# Patient Record
Sex: Female | Born: 1960 | Race: White | Hispanic: No | Marital: Married | Smoking: Never smoker
Health system: Southern US, Community
[De-identification: ages and names within clinical notes are randomized; demographics above are authoritative.]

## PROBLEM LIST (undated history)

## (undated) DIAGNOSIS — K219 Gastro-esophageal reflux disease without esophagitis: Secondary | ICD-10-CM

## (undated) DIAGNOSIS — R011 Cardiac murmur, unspecified: Secondary | ICD-10-CM

## (undated) DIAGNOSIS — I370 Nonrheumatic pulmonary valve stenosis: Secondary | ICD-10-CM

## (undated) HISTORY — PX: OTHER SURGICAL HISTORY: SHX169

## (undated) HISTORY — PX: AUGMENTATION MAMMAPLASTY: SUR837

## (undated) HISTORY — PX: GALLBLADDER SURGERY: SHX652

---

## 1997-11-26 ENCOUNTER — Ambulatory Visit (HOSPITAL_COMMUNITY): Admission: RE | Admit: 1997-11-26 | Discharge: 1997-11-26 | Payer: Self-pay | Admitting: Specialist

## 1998-03-18 ENCOUNTER — Other Ambulatory Visit: Admission: RE | Admit: 1998-03-18 | Discharge: 1998-03-18 | Payer: Self-pay | Admitting: Gynecology

## 1999-03-19 ENCOUNTER — Other Ambulatory Visit: Admission: RE | Admit: 1999-03-19 | Discharge: 1999-03-19 | Payer: Self-pay | Admitting: Gynecology

## 2000-04-20 ENCOUNTER — Encounter: Payer: Self-pay | Admitting: Plastic Surgery

## 2000-04-20 ENCOUNTER — Encounter: Admission: RE | Admit: 2000-04-20 | Discharge: 2000-04-20 | Payer: Self-pay | Admitting: Plastic Surgery

## 2001-05-15 ENCOUNTER — Other Ambulatory Visit: Admission: RE | Admit: 2001-05-15 | Discharge: 2001-05-15 | Payer: Self-pay | Admitting: Gynecology

## 2001-06-07 ENCOUNTER — Encounter: Admission: RE | Admit: 2001-06-07 | Discharge: 2001-06-07 | Payer: Self-pay | Admitting: Gynecology

## 2001-06-07 ENCOUNTER — Encounter: Payer: Self-pay | Admitting: Gynecology

## 2002-05-21 ENCOUNTER — Other Ambulatory Visit: Admission: RE | Admit: 2002-05-21 | Discharge: 2002-05-21 | Payer: Self-pay | Admitting: Gynecology

## 2002-06-11 ENCOUNTER — Ambulatory Visit (HOSPITAL_COMMUNITY): Admission: RE | Admit: 2002-06-11 | Discharge: 2002-06-11 | Payer: Self-pay | Admitting: Gynecology

## 2002-06-11 ENCOUNTER — Encounter: Payer: Self-pay | Admitting: Gynecology

## 2003-06-25 ENCOUNTER — Other Ambulatory Visit: Admission: RE | Admit: 2003-06-25 | Discharge: 2003-06-25 | Payer: Self-pay | Admitting: Obstetrics and Gynecology

## 2003-06-25 ENCOUNTER — Ambulatory Visit (HOSPITAL_COMMUNITY): Admission: RE | Admit: 2003-06-25 | Discharge: 2003-06-25 | Payer: Self-pay | Admitting: Gynecology

## 2004-07-16 ENCOUNTER — Ambulatory Visit (HOSPITAL_COMMUNITY): Admission: RE | Admit: 2004-07-16 | Discharge: 2004-07-16 | Payer: Self-pay | Admitting: Gynecology

## 2004-07-16 ENCOUNTER — Other Ambulatory Visit: Admission: RE | Admit: 2004-07-16 | Discharge: 2004-07-16 | Payer: Self-pay | Admitting: Gynecology

## 2005-08-05 ENCOUNTER — Other Ambulatory Visit: Admission: RE | Admit: 2005-08-05 | Discharge: 2005-08-05 | Payer: Self-pay | Admitting: Gynecology

## 2005-08-12 ENCOUNTER — Ambulatory Visit (HOSPITAL_COMMUNITY): Admission: RE | Admit: 2005-08-12 | Discharge: 2005-08-12 | Payer: Self-pay | Admitting: Gynecology

## 2006-08-15 ENCOUNTER — Ambulatory Visit (HOSPITAL_COMMUNITY): Admission: RE | Admit: 2006-08-15 | Discharge: 2006-08-15 | Payer: Self-pay | Admitting: Gynecology

## 2007-08-17 ENCOUNTER — Ambulatory Visit (HOSPITAL_COMMUNITY): Admission: RE | Admit: 2007-08-17 | Discharge: 2007-08-17 | Payer: Self-pay | Admitting: Gynecology

## 2008-08-21 ENCOUNTER — Ambulatory Visit (HOSPITAL_COMMUNITY): Admission: RE | Admit: 2008-08-21 | Discharge: 2008-08-21 | Payer: Self-pay | Admitting: Gynecology

## 2009-06-02 ENCOUNTER — Ambulatory Visit: Payer: Self-pay | Admitting: Family Medicine

## 2009-06-09 ENCOUNTER — Inpatient Hospital Stay: Payer: Self-pay | Admitting: Surgery

## 2009-07-03 ENCOUNTER — Ambulatory Visit: Payer: Self-pay | Admitting: Family Medicine

## 2009-07-15 ENCOUNTER — Ambulatory Visit: Payer: Self-pay | Admitting: Family Medicine

## 2009-09-09 ENCOUNTER — Ambulatory Visit (HOSPITAL_COMMUNITY): Admission: RE | Admit: 2009-09-09 | Discharge: 2009-09-09 | Payer: Self-pay | Admitting: Gynecology

## 2009-10-30 ENCOUNTER — Ambulatory Visit: Payer: Self-pay | Admitting: Family Medicine

## 2010-03-11 IMAGING — CT CT CHEST W/ CM
1 series · 15 of 33 positions shown, 19 images · IV contrast (agent unspecified)
Comparison: none

REASON FOR EXAM: abn CXR  prominence left hilium
COMMENTS:

PROCEDURE:     CADET - CADET CHEST WITH CONTRAST  - July 15, 2009 [DATE]
RESULT:
TECHNIQUE: CT of the chest is performed utilizing 75 ml of Csovue-IVQ
iodinated intravenous contrast along with reconstruction at 5 mm slice
thickness.
There is no previous exam for comparison.

[Series 2: soft tissue · axial · 0.77mm/px · z∈[+112,+377]mm · 15 of 63 slices shown, 19 images]
[im 5/63  mediastinal]
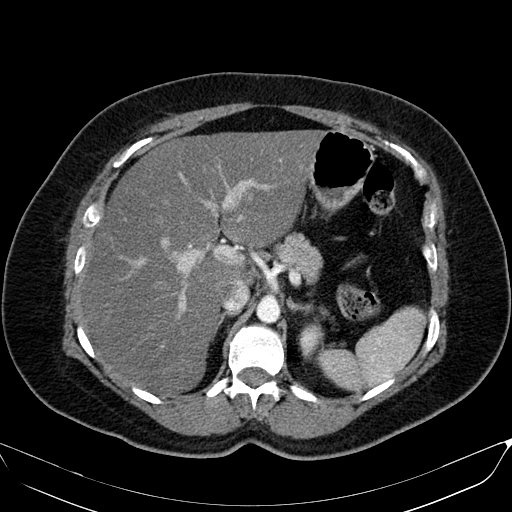
[im 5/63  lung]
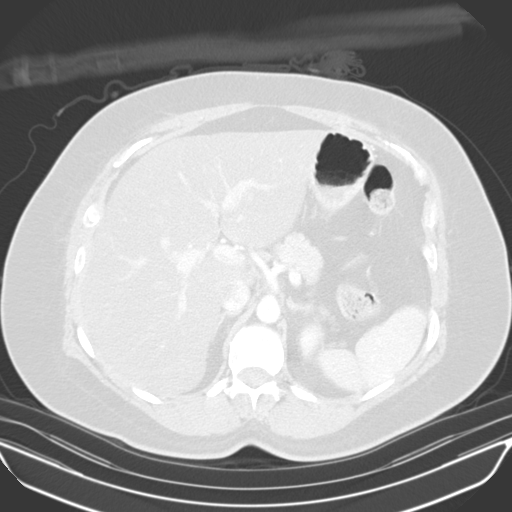
[im 10/63  lung]
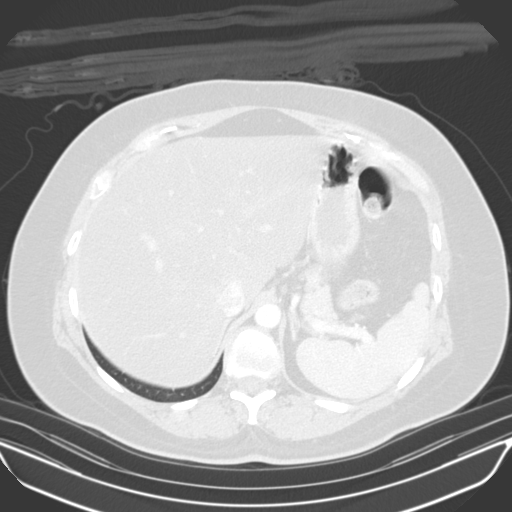
[im 13/63  lung]
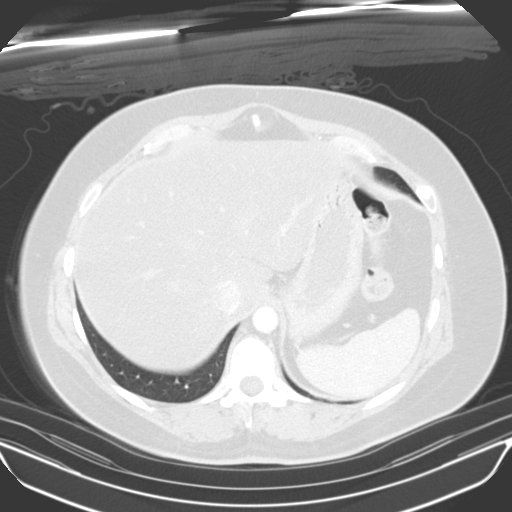
[im 17/63  lung]
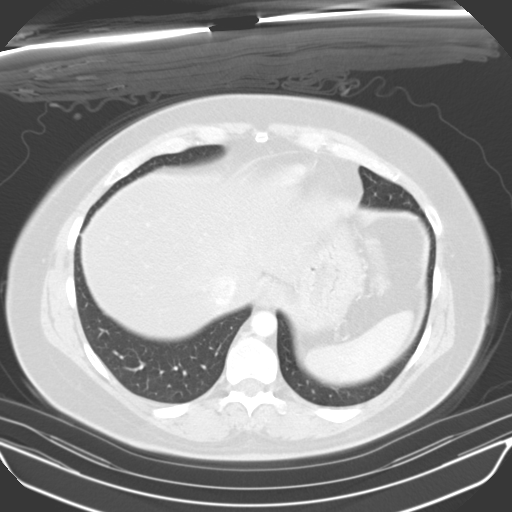
[im 21/63  mediastinal]
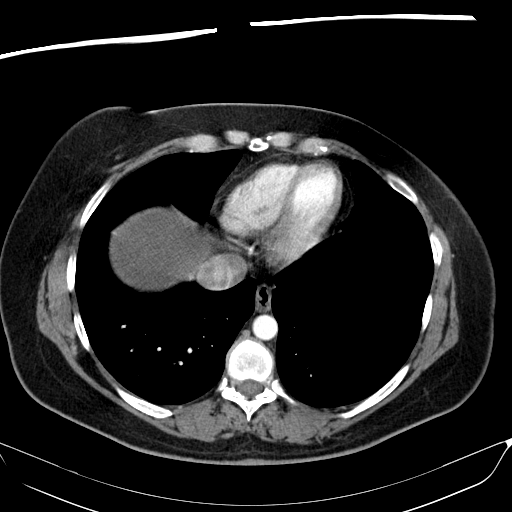
[im 21/63  lung]
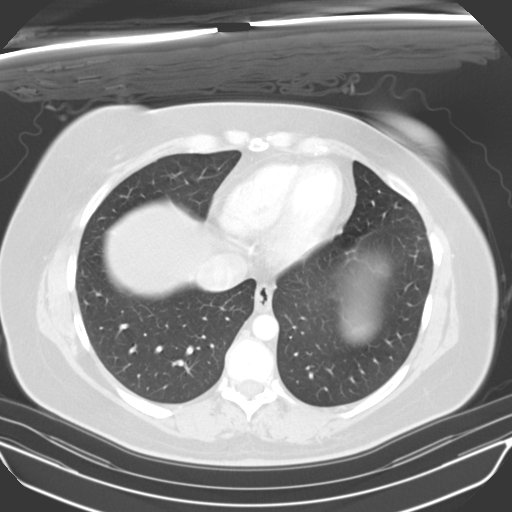
[im 25/63  lung]
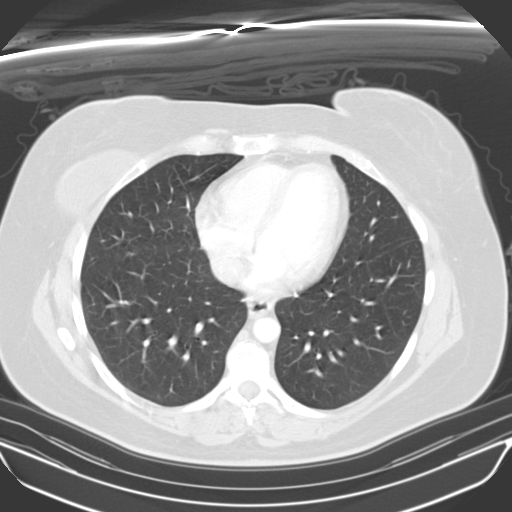
[im 28/63  lung]
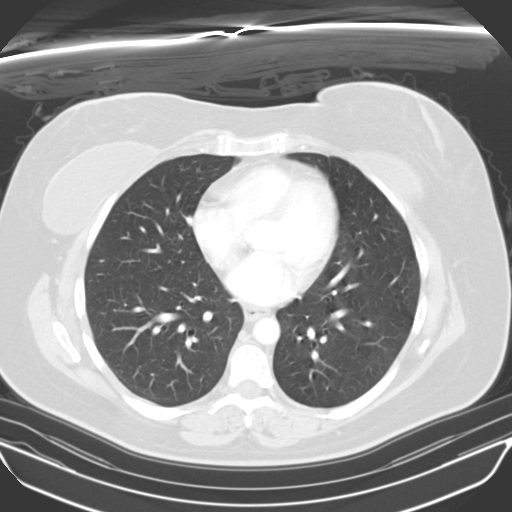
[im 33/63  lung]
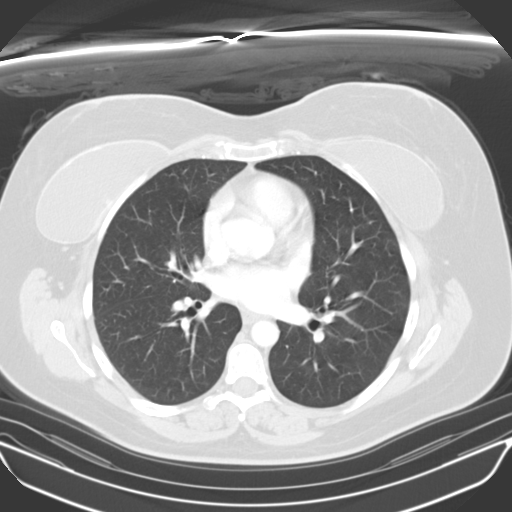
[im 35/63  mediastinal]
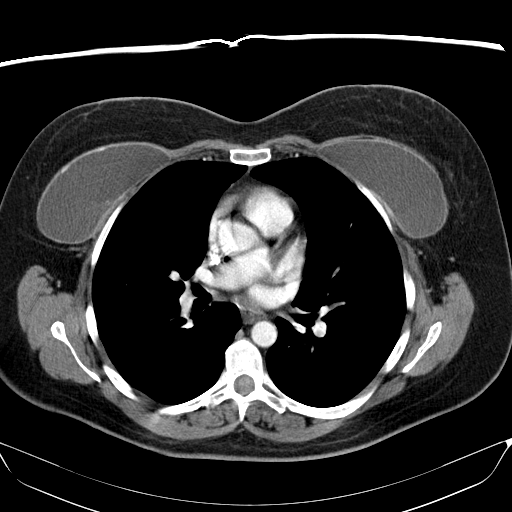
[im 35/63  lung]
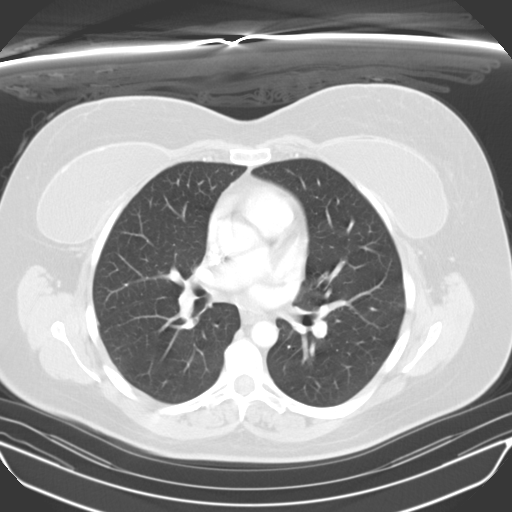
[im 38/63  lung]
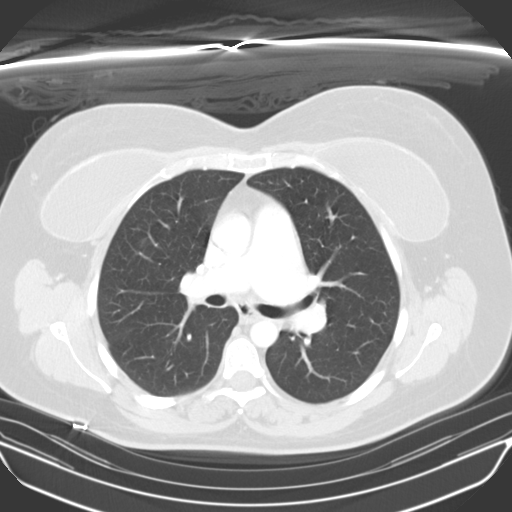
[im 42/63  lung]
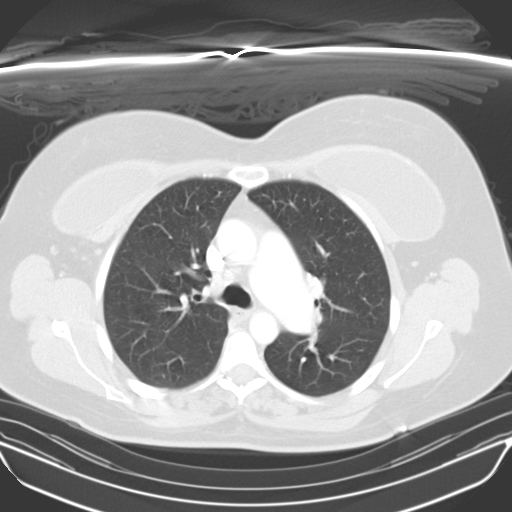
[im 46/63  lung]
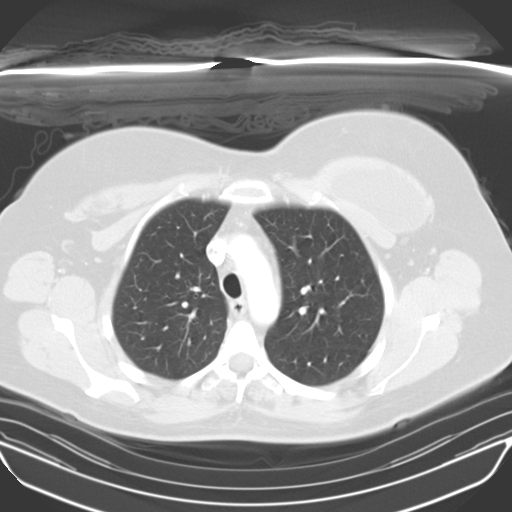
[im 50/63  mediastinal]
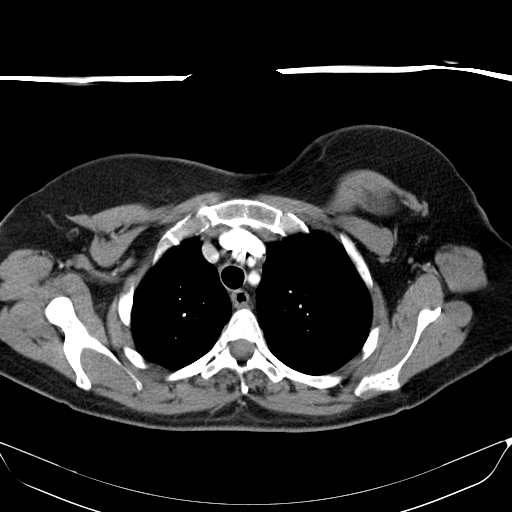
[im 50/63  lung]
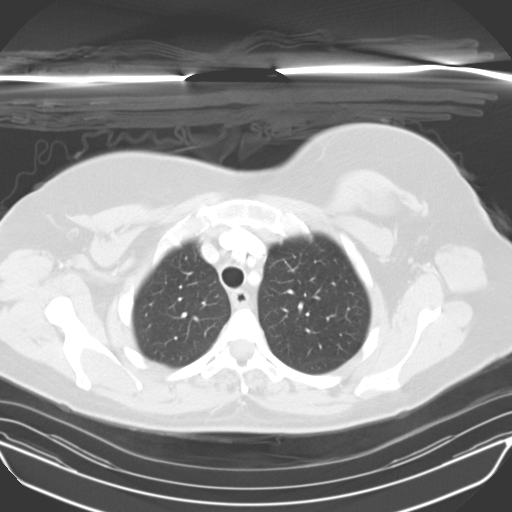
[im 53/63  lung]
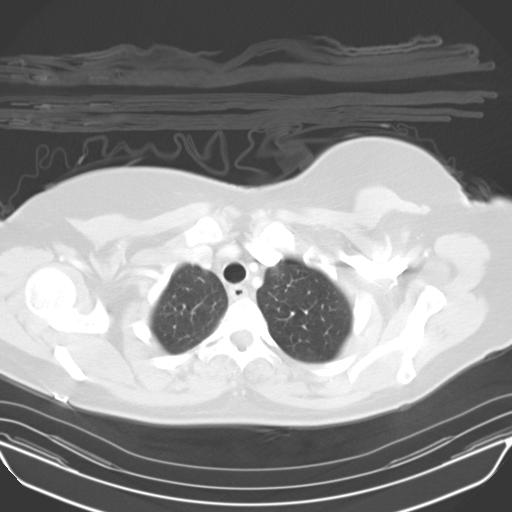
[im 58/63  lung]
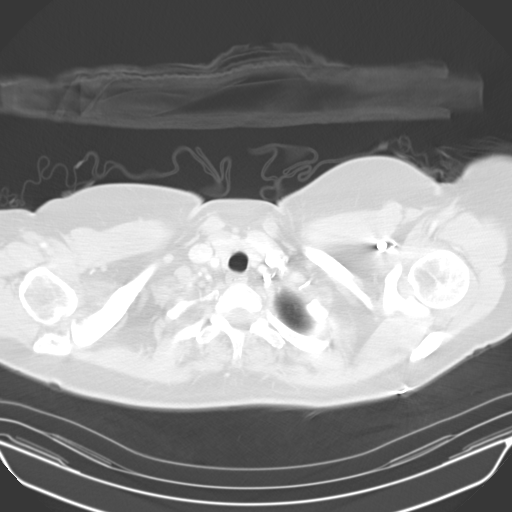

[15 of 33 positions shown; findings below may reference images not displayed]

FINDINGS: Prominence in the left hilar region is likely caused by an
enlarged pulmonary artery which on coronal reconstructions measures up to
4.7 cm in diameter on the left. The right pulmonary artery is less prominent
measuring up to 2.8 cm x 3.1 cm. The aorta is normal in caliber. The lungs
are clear. There is no adenopathy or infiltrate. There is significant fatty
infiltration of the liver diffusely. The adrenal glands are normal. There is
no effusion or pneumothorax. Breast implants are present.
IMPRESSION: 1.  Significant dilation of the left pulmonary artery. The right pulmonary
artery appears to be less prominent and the main pulmonary artery is within
normal limits proximally. This likely causes the prominence at the AP window
on the chest x-ray.
2.  No pulmonary embolism, aortic aneurysm or acute pulmonary parenchymal
abnormality.
3.  Diffuse fatty infiltration of the liver.

## 2010-08-10 ENCOUNTER — Other Ambulatory Visit (HOSPITAL_COMMUNITY): Payer: Self-pay | Admitting: Gynecology

## 2010-08-10 DIAGNOSIS — Z1231 Encounter for screening mammogram for malignant neoplasm of breast: Secondary | ICD-10-CM

## 2010-09-14 ENCOUNTER — Ambulatory Visit (HOSPITAL_COMMUNITY)
Admission: RE | Admit: 2010-09-14 | Discharge: 2010-09-14 | Disposition: A | Discharge status: Home / Self Care | Payer: BC Managed Care – PPO | Source: Ambulatory Visit | Attending: Gynecology | Admitting: Gynecology

## 2010-09-14 DIAGNOSIS — Z1231 Encounter for screening mammogram for malignant neoplasm of breast: Secondary | ICD-10-CM | POA: Insufficient documentation

## 2011-08-23 ENCOUNTER — Other Ambulatory Visit (HOSPITAL_COMMUNITY): Payer: Self-pay | Admitting: Gynecology

## 2011-08-23 DIAGNOSIS — Z1239 Encounter for other screening for malignant neoplasm of breast: Secondary | ICD-10-CM

## 2011-09-16 ENCOUNTER — Ambulatory Visit (HOSPITAL_COMMUNITY): Payer: BC Managed Care – PPO

## 2011-09-23 ENCOUNTER — Other Ambulatory Visit (HOSPITAL_COMMUNITY): Payer: Self-pay | Admitting: Gynecology

## 2011-09-23 ENCOUNTER — Ambulatory Visit (HOSPITAL_COMMUNITY)
Admission: RE | Admit: 2011-09-23 | Discharge: 2011-09-23 | Disposition: A | Discharge status: Home / Self Care | Payer: BC Managed Care – PPO | Source: Ambulatory Visit | Attending: Gynecology | Admitting: Gynecology

## 2011-09-23 DIAGNOSIS — Z1231 Encounter for screening mammogram for malignant neoplasm of breast: Secondary | ICD-10-CM

## 2011-09-23 DIAGNOSIS — Z1239 Encounter for other screening for malignant neoplasm of breast: Secondary | ICD-10-CM

## 2012-10-12 ENCOUNTER — Other Ambulatory Visit (HOSPITAL_COMMUNITY): Payer: Self-pay | Admitting: Gynecology

## 2012-10-12 DIAGNOSIS — Z1231 Encounter for screening mammogram for malignant neoplasm of breast: Secondary | ICD-10-CM

## 2012-10-25 ENCOUNTER — Ambulatory Visit (HOSPITAL_COMMUNITY)
Admission: RE | Admit: 2012-10-25 | Discharge: 2012-10-25 | Disposition: A | Discharge status: Home / Self Care | Payer: BC Managed Care – PPO | Source: Ambulatory Visit | Attending: Gynecology | Admitting: Gynecology

## 2012-10-25 DIAGNOSIS — Z1231 Encounter for screening mammogram for malignant neoplasm of breast: Secondary | ICD-10-CM | POA: Insufficient documentation

## 2013-01-26 ENCOUNTER — Ambulatory Visit: Payer: Self-pay | Admitting: Family Medicine

## 2013-06-19 ENCOUNTER — Ambulatory Visit: Payer: Self-pay | Admitting: Internal Medicine

## 2013-06-19 LAB — URINALYSIS, COMPLETE
Nitrite: NEGATIVE
Specific Gravity: 1.025 (ref 1.003–1.030)

## 2013-06-21 LAB — URINE CULTURE

## 2013-07-17 ENCOUNTER — Ambulatory Visit: Payer: Self-pay | Admitting: Family Medicine

## 2013-09-27 ENCOUNTER — Other Ambulatory Visit (HOSPITAL_COMMUNITY): Payer: Self-pay | Admitting: Gynecology

## 2013-09-27 DIAGNOSIS — Z1231 Encounter for screening mammogram for malignant neoplasm of breast: Secondary | ICD-10-CM

## 2013-10-25 ENCOUNTER — Ambulatory Visit: Payer: Self-pay

## 2013-10-25 LAB — RAPID STREP-A WITH REFLX: Micro Text Report: NEGATIVE

## 2013-10-27 LAB — BETA STREP CULTURE(ARMC)

## 2013-10-29 ENCOUNTER — Ambulatory Visit (HOSPITAL_COMMUNITY)
Admission: RE | Admit: 2013-10-29 | Discharge: 2013-10-29 | Disposition: A | Discharge status: Home / Self Care | Payer: BC Managed Care – PPO | Source: Ambulatory Visit | Attending: Gynecology | Admitting: Gynecology

## 2013-10-29 DIAGNOSIS — Z978 Presence of other specified devices: Secondary | ICD-10-CM | POA: Insufficient documentation

## 2013-10-29 DIAGNOSIS — Z1231 Encounter for screening mammogram for malignant neoplasm of breast: Secondary | ICD-10-CM | POA: Insufficient documentation

## 2013-12-18 ENCOUNTER — Ambulatory Visit: Payer: Self-pay | Admitting: Internal Medicine

## 2013-12-18 LAB — URINALYSIS, COMPLETE
BILIRUBIN, UR: NEGATIVE
Glucose,UR: 100 mg/dL (ref 0–75)
KETONE: NEGATIVE
Nitrite: POSITIVE
PH: 6 (ref 4.5–8.0)
Specific Gravity: 1.025 (ref 1.003–1.030)

## 2013-12-20 LAB — URINE CULTURE

## 2014-02-10 ENCOUNTER — Ambulatory Visit: Payer: Self-pay

## 2014-03-14 ENCOUNTER — Ambulatory Visit: Payer: Self-pay | Admitting: Gastroenterology

## 2014-03-31 ENCOUNTER — Ambulatory Visit: Payer: Self-pay | Admitting: Physician Assistant

## 2014-03-31 LAB — URINALYSIS, COMPLETE
BILIRUBIN, UR: NEGATIVE
GLUCOSE, UR: NEGATIVE
KETONE: NEGATIVE
Nitrite: NEGATIVE
PH: 6 (ref 5.0–8.0)
WBC UR: >30 /HPF (ref 0–5)

## 2014-04-02 LAB — URINE CULTURE

## 2014-08-18 ENCOUNTER — Ambulatory Visit: Payer: Self-pay | Admitting: Physician Assistant

## 2014-09-27 ENCOUNTER — Other Ambulatory Visit (HOSPITAL_COMMUNITY): Payer: Self-pay | Admitting: Obstetrics and Gynecology

## 2014-09-27 DIAGNOSIS — Z1231 Encounter for screening mammogram for malignant neoplasm of breast: Secondary | ICD-10-CM

## 2014-10-31 ENCOUNTER — Ambulatory Visit (HOSPITAL_COMMUNITY)
Admission: RE | Admit: 2014-10-31 | Discharge: 2014-10-31 | Disposition: A | Discharge status: Home / Self Care | Payer: BLUE CROSS/BLUE SHIELD | Source: Ambulatory Visit | Attending: Obstetrics and Gynecology | Admitting: Obstetrics and Gynecology

## 2014-10-31 DIAGNOSIS — Z1231 Encounter for screening mammogram for malignant neoplasm of breast: Secondary | ICD-10-CM | POA: Insufficient documentation

## 2015-03-27 ENCOUNTER — Encounter: Payer: Self-pay | Admitting: Emergency Medicine

## 2015-03-27 ENCOUNTER — Ambulatory Visit
Admission: EM | Admit: 2015-03-27 | Discharge: 2015-03-27 | Disposition: A | Discharge status: Home / Self Care | Payer: BLUE CROSS/BLUE SHIELD | Attending: Family Medicine | Admitting: Family Medicine

## 2015-03-27 DIAGNOSIS — H9203 Otalgia, bilateral: Secondary | ICD-10-CM

## 2015-03-27 DIAGNOSIS — H6983 Other specified disorders of Eustachian tube, bilateral: Secondary | ICD-10-CM | POA: Diagnosis not present

## 2015-03-27 DIAGNOSIS — T7029XA Other effects of high altitude, initial encounter: Secondary | ICD-10-CM

## 2015-03-27 MED ORDER — FEXOFENADINE-PSEUDOEPHED ER 180-240 MG PO TB24: 1.0000 | ORAL_TABLET | Freq: Every day | ORAL | Status: DC

## 2015-03-27 MED ORDER — CEFUROXIME AXETIL 500 MG PO TABS: 500.0000 mg | ORAL_TABLET | Freq: Two times a day (BID) | ORAL | Status: DC

## 2015-03-27 MED ORDER — MOMETASONE FUROATE 50 MCG/ACT NA SUSP: 2.0000 | Freq: Every day | NASAL | Status: DC

## 2015-03-27 NOTE — ED Provider Notes (Addendum)
CSN: [id number]     Arrival date & time 03/27/15  1616 History   First MD Initiated Contact with Patient 03/27/15 1700     Chief Complaint  Patient presents with  . Sinusitis  . Otalgia   (Consider location/radiation/quality/duration/timing/severity/associated sxs/prior Treatment) Patient is a 54 y.o. female presenting with sinusitis and ear pain. The history is provided by the patient. No language interpreter was used.  Sinusitis Pain details:    Location:  Maxillary   Quality:  Aching, dull and pressure   Severity:  Moderate Progression:  Waxing and waning Relieved by:  Nothing Ineffective treatments:  None tried Associated symptoms: ear pain and swollen glands   Associated symptoms: no chest pain, no chills, no congestion, no fatigue, no headaches, no hoarse voice, no rhinorrhea, no sneezing and no sore throat   Ear pain:    Severity:  Moderate   Onset quality:  Gradual   Progression:  Worsening Risk factors: no allergic reaction, no asthma, no BiPAP, no COPD, no CPAP use, no diabetes, no nasal cannula and no nasal polyps   Otalgia Associated symptoms: no congestion, no headaches, no rhinorrhea and no sore throat     History reviewed. No pertinent past medical history. Past Surgical History  Procedure Laterality Date  . Gallbladder surgery    . Tubiligation     History reviewed. No pertinent family history. Social History  Substance Use Topics  . Smoking status: Never Smoker   . Smokeless tobacco: None  . Alcohol Use: No   OB History    No data available     Review of Systems  Constitutional: Negative for chills and fatigue.  HENT: Positive for ear pain. Negative for congestion, hoarse voice, rhinorrhea, sneezing and sore throat.   Cardiovascular: Negative for chest pain.  Skin: Negative.   Neurological: Negative for headaches.  All other systems reviewed and are negative.   Allergies  Penicillins  Home Medications   Prior to Admission medications    Medication Sig Start Date End Date Taking? Authorizing Provider  cefUROXime (CEFTIN) 500 MG tablet Take 1 tablet (500 mg total) by mouth 2 (two) times daily. May fill between 03/29/2015 and 04/29/2015. Only fill if symptoms have not improved. 03/27/15   Hassan Rowan, MD  fexofenadine-pseudoephedrine (ALLEGRA-D ALLERGY & CONGESTION) 180-240 MG 24 hr tablet Take 1 tablet by mouth daily. 03/27/15   Hassan Rowan, MD  mometasone (NASONEX) 50 MCG/ACT nasal spray Place 2 sprays into the nose daily. 03/27/15   Hassan Rowan, MD   Meds Ordered and Administered this Visit  Medications - No data to display  BP 125/63 mmHg  Pulse 85  Temp(Src) 97.2 F (36.2 C) (Tympanic)  Resp 16  Ht  (1.702 m)  Wt 246 lb (111.585 kg)  BMI 38.52 kg/m2  SpO2 98% No data found.   Physical Exam  Constitutional: She is oriented to person, place, and time. She appears well-developed and well-nourished.  HENT:  Head: Normocephalic and atraumatic.  Right Ear: Hearing, external ear and ear canal normal. Tympanic membrane is bulging.  Left Ear: Hearing, external ear and ear canal normal. Tympanic membrane is bulging.  Nose: Mucosal edema and rhinorrhea present. Right sinus exhibits maxillary sinus tenderness. Right sinus exhibits no frontal sinus tenderness. Left sinus exhibits maxillary sinus tenderness. Left sinus exhibits no frontal sinus tenderness.  Mouth/Throat: Uvula is midline. She does not have dentures. No oral lesions. Normal dentition. No dental caries. No posterior oropharyngeal edema or posterior oropharyngeal erythema.  Eyes: Conjunctivae are  normal. Pupils are equal, round, and reactive to light.  Neck: Normal range of motion. Neck supple. No tracheal deviation present.  Musculoskeletal: Normal range of motion.  Lymphadenopathy:    She has cervical adenopathy.  Neurological: She is alert and oriented to person, place, and time.  Skin: Skin is warm and dry.  Psychiatric: Thought content normal.  Vitals  reviewed.   ED Course  Procedures (including critical care time)  Labs Review Labs Reviewed - No data to display  Imaging Review No results found.   Visual Acuity Review  Right Eye Distance:   Left Eye Distance:   Bilateral Distance:    Right Eye Near:   Left Eye Near:    Bilateral Near:         MDM   1. Barotrauma, initial encounter   2. Eustachian tube dysfunction, bilateral   3. Ear pain, bilateral    Explained patient this is eustachian tube dysfunction. Hopefully by using Nasonex nasal spray Allegra-D which can help open up the eustachian tube and make some improvement with the discomfort. If she is not better next 4872 hours she can then start Ceftin 500 mg 1 tablet twice a day. Patient declines work note.  Hassan Rowan, MD 03/27/15 1753  Hassan Rowan, MD 03/27/15 1754

## 2015-03-27 NOTE — Discharge Instructions (Signed)
Ear Barotrauma Ear barotrauma is injury to the eardrum that is caused by a pressure difference between the inside and the outside of the eardrum. Things that can make this happen include:   Flying in an airplane.  Coming to the surface too quickly after scuba diving.  Going to higher places (high altitudes) quickly.  Having an ear infection.  Being too close to a loud noise.  Being hit hard in the ear.  Working in a pressurized room.  Having swelling (inflammation) in your ear from a cold, an allergy, or an infection. HOME CARE  Take medicines only as told by your doctor.  Do not do the following until your doctor says it is okay:  Travel to places that are high above sea level.  Work in a pressurized room.  Scuba dive.  Use techniques to help with pressure changes in your ear as told by your doctor. These may include:  Chewing gum.  Swallowing.  Holding your nose and gently blowing to pop your ears.  Keep your ears dry. Use the corner of a towel to gently get water out of your ears after a shower or bath.  Keep all follow-up visits as told by your doctor. This is important. GET HELP IF:  Your symptoms do not get better, or they get worse.  You have a fever.  Your symptoms affect only one ear. GET HELP RIGHT AWAY IF:  You have a very bad headache.  You feel dizzy.  You have very bad ear pain.  You have blood or yellowish-white fluid (pus) coming from your ear.  You have hearing loss.  Your outer ear gets red and swollen.  You have swelling in the area behind your earlobe.   This information is not intended to replace advice given to you by your health care provider. Make sure you discuss any questions you have with your health care provider.   Document Released: 11/25/2009 Document Revised: 06/28/2014 Document Reviewed: 01/21/2014 Elsevier Interactive Patient Education [employer]

## 2015-03-27 NOTE — ED Notes (Signed)
Sinus pressure and ear pain for 6 days

## 2015-08-18 ENCOUNTER — Ambulatory Visit
Admission: EM | Admit: 2015-08-18 | Discharge: 2015-08-18 | Disposition: A | Discharge status: Home / Self Care | Payer: BLUE CROSS/BLUE SHIELD | Attending: Family Medicine | Admitting: Family Medicine

## 2015-08-18 ENCOUNTER — Encounter: Payer: Self-pay | Admitting: *Deleted

## 2015-08-18 DIAGNOSIS — J069 Acute upper respiratory infection, unspecified: Secondary | ICD-10-CM

## 2015-08-18 HISTORY — DX: Gastro-esophageal reflux disease without esophagitis: K21.9

## 2015-08-18 LAB — RAPID STREP SCREEN (MED CTR MEBANE ONLY): STREPTOCOCCUS, GROUP A SCREEN (DIRECT): NEGATIVE

## 2015-08-18 MED ORDER — AZITHROMYCIN 250 MG PO TABS: ORAL_TABLET | ORAL | Status: DC

## 2015-08-18 MED ORDER — FLUTICASONE PROPIONATE 50 MCG/ACT NA SUSP: 2.0000 | Freq: Every day | NASAL | Status: DC

## 2015-08-18 NOTE — Discharge Instructions (Signed)
Cool Mist Vaporizers °Vaporizers may help relieve the symptoms of a cough and cold. They add moisture to the air, which helps mucus to become thinner and less sticky. This makes it easier to breathe and cough up secretions. Cool mist vaporizers do not cause serious burns like hot mist vaporizers, which may also be called steamers or humidifiers. Vaporizers have not been proven to help with colds. You should not use a vaporizer if you are allergic to mold. °HOME CARE INSTRUCTIONS °· Follow the package instructions for the vaporizer. °· Do not use anything other than distilled water in the vaporizer. °· Do not run the vaporizer all of the time. This can cause mold or bacteria to grow in the vaporizer. °· Clean the vaporizer after each time it is used. °· Clean and dry the vaporizer well before storing it. °· Stop using the vaporizer if worsening respiratory symptoms develop. °  °This information is not intended to replace advice given to you by your health care provider. Make sure you discuss any questions you have with your health care provider. °  °Document Released: 03/04/2004 Document Revised: 06/12/2013 Document Reviewed: 10/25/2012 °Elsevier Interactive Patient Education ©2016 [employer] ° °Upper Respiratory Infection, Adult °Most upper respiratory infections (URIs) are a viral infection of the air passages leading to the lungs. A URI affects the nose, throat, and upper air passages. The most common type of URI is nasopharyngitis and is typically referred to as "the common cold." °URIs run their course and usually go away on their own. Most of the time, a URI does not require medical attention, but sometimes a bacterial infection in the upper airways can follow a viral infection. This is called a secondary infection. Sinus and middle ear infections are common types of secondary upper respiratory infections. °Bacterial pneumonia can also complicate a URI. A URI can worsen asthma and chronic obstructive  pulmonary disease (COPD). Sometimes, these complications can require emergency medical care and may be life threatening.  °CAUSES °Almost all URIs are caused by viruses. A virus is a type of germ and can spread from one person to another.  °RISKS FACTORS °You may be at risk for a URI if:  °· You smoke.   °· You have chronic heart or lung disease. °· You have a weakened defense (immune) system.   °· You are very young or very old.   °· You have nasal allergies or asthma. °· You work in crowded or poorly ventilated areas. °· You work in health care facilities or schools. °SIGNS AND SYMPTOMS  °Symptoms typically develop 2-3 days after you come in contact with a cold virus. Most viral URIs last 7-10 days. However, viral URIs from the influenza virus (flu virus) can last 14-18 days and are typically more severe. Symptoms may include:  °· Runny or stuffy (congested) nose.   °· Sneezing.   °· Cough.   °· Sore throat.   °· Headache.   °· Fatigue.   °· Fever.   °· Loss of appetite.   °· Pain in your forehead, behind your eyes, and over your cheekbones (sinus pain). °· Muscle aches.   °DIAGNOSIS  °Your health care provider may diagnose a URI by: °· Physical exam. °· Tests to check that your symptoms are not due to another condition such as: °¨ Strep throat. °¨ Sinusitis. °¨ Pneumonia. °¨ Asthma. °TREATMENT  °A URI goes away on its own with time. It cannot be cured with medicines, but medicines may be prescribed or recommended to relieve symptoms. Medicines may help: °· Reduce your fever. °· Reduce   your cough. °· Relieve nasal congestion. °HOME CARE INSTRUCTIONS  °· Take medicines only as directed by your health care provider.   °· Gargle warm saltwater or take cough drops to comfort your throat as directed by your health care provider. °· Use a warm mist humidifier or inhale steam from a shower to increase air moisture. This may make it easier to breathe. °· Drink enough fluid to keep your urine clear or pale yellow.   °· Eat  soups and other clear broths and maintain good nutrition.   °· Rest as needed.   °· Return to work when your temperature has returned to normal or as your health care provider advises. You may need to stay home longer to avoid infecting others. You can also use a face mask and careful hand washing to prevent spread of the virus. °· Increase the usage of your inhaler if you have asthma.   °· Do not use any tobacco products, including cigarettes, chewing tobacco, or electronic cigarettes. If you need help quitting, ask your health care provider. °PREVENTION  °The best way to protect yourself from getting a cold is to practice good hygiene.  °· Avoid oral or hand contact with people with cold symptoms.   °· Wash your hands often if contact occurs.   °There is no clear evidence that vitamin C, vitamin E, echinacea, or exercise reduces the chance of developing a cold. However, it is always recommended to get plenty of rest, exercise, and practice good nutrition.  °SEEK MEDICAL CARE IF:  °· You are getting worse rather than better.   °· Your symptoms are not controlled by medicine.   °· You have chills. °· You have worsening shortness of breath. °· You have brown or red mucus. °· You have yellow or brown nasal discharge. °· You have pain in your face, especially when you bend forward. °· You have a fever. °· You have swollen neck glands. °· You have pain while swallowing. °· You have white areas in the back of your throat. °SEEK IMMEDIATE MEDICAL CARE IF:  °· You have severe or persistent: °¨ Headache. °¨ Ear pain. °¨ Sinus pain. °¨ Chest pain. °· You have chronic lung disease and any of the following: °¨ Wheezing. °¨ Prolonged cough. °¨ Coughing up blood. °¨ A change in your usual mucus. °· You have a stiff neck. °· You have changes in your: °¨ Vision. °¨ Hearing. °¨ Thinking. °¨ Mood. °MAKE SURE YOU:  °· Understand these instructions. °· Will watch your condition. °· Will get help right away if you are not doing well or  get worse. °  °This information is not intended to replace advice given to you by your health care provider. Make sure you discuss any questions you have with your health care provider. °  °Document Released: 12/01/2000 Document Revised: 10/22/2014 Document Reviewed: 09/12/2013 °Elsevier Interactive Patient Education ©2016 Elsevier Inc[employer] ° °

## 2015-08-18 NOTE — ED Provider Notes (Signed)
CSN: [id number]     Arrival date & time 08/18/15  1033 History   First MD Initiated Contact with Patient 08/18/15 1139     Chief Complaint  Patient presents with  . Generalized Body Aches  . Fever   (Consider location/radiation/quality/duration/timing/severity/associated sxs/prior Treatment) HPI   This 55 year old female who presents with a three-day history of ear pain throat pain and fever. States that she's had fever and chills never took her temperature at since she doesn't have a thermometer at home. She has not had any coughing. She did have a flu shot in October  Past Medical History  Diagnosis Date  . GERD (gastroesophageal reflux disease)    Past Surgical History  Procedure Laterality Date  . Gallbladder surgery    . Tubiligation     History reviewed. No pertinent family history. Social History  Substance Use Topics  . Smoking status: Never Smoker   . Smokeless tobacco: None  . Alcohol Use: No   OB History    No data available     Review of Systems  Constitutional: Negative for chills, activity change and fatigue.  HENT: Positive for congestion, ear pain, postnasal drip, sinus pressure and sore throat.   Respiratory: Negative for cough.   All other systems reviewed and are negative.   Allergies  Penicillins  Home Medications   Prior to Admission medications   Medication Sig Start Date End Date Taking? Authorizing Provider  esomeprazole (NEXIUM) 40 MG capsule Take 40 mg by mouth daily at 12 noon.   Yes Historical Provider, MD  fexofenadine-pseudoephedrine (ALLEGRA-D ALLERGY & CONGESTION) 180-240 MG 24 hr tablet Take 1 tablet by mouth daily. 03/27/15  Yes Hassan Rowan, MD  azithromycin (ZITHROMAX Z-PAK) 250 MG tablet Use as per package instructions 08/18/15   Lutricia Feil, PA-C  cefUROXime (CEFTIN) 500 MG tablet Take 1 tablet (500 mg total) by mouth 2 (two) times daily. May fill between 03/29/2015 and 04/29/2015. Only fill if symptoms have not improved.  03/27/15   Hassan Rowan, MD  fluticasone (FLONASE) 50 MCG/ACT nasal spray Place 2 sprays into both nostrils daily. 08/18/15   Lutricia Feil, PA-C  mometasone (NASONEX) 50 MCG/ACT nasal spray Place 2 sprays into the nose daily. 03/27/15   Hassan Rowan, MD   Meds Ordered and Administered this Visit  Medications - No data to display  BP 104/74 mmHg  Pulse 116  Temp(Src) 99.6 F (37.6 C) (Oral)  Resp 18  Ht  (1.702 m)  Wt 210 lb (95.255 kg)  BMI 32.88 kg/m2  SpO2 97% No data found.   Physical Exam  Constitutional: She is oriented to person, place, and time. She appears well-developed and well-nourished. No distress.  HENT:  Head: Normocephalic and atraumatic.  Right Ear: External ear normal.  Left Ear: External ear normal.  Nose: Nose normal.  Mouth/Throat: Oropharynx is clear and moist. No oropharyngeal exudate.  Eyes: Conjunctivae are normal. Pupils are equal, round, and reactive to light.  Neck: Normal range of motion. Neck supple.  Pulmonary/Chest: Effort normal and breath sounds normal. No respiratory distress. She has no wheezes. She has no rales.  Musculoskeletal: Normal range of motion. She exhibits no edema or tenderness.  Lymphadenopathy:    She has no cervical adenopathy.  Neurological: She is alert and oriented to person, place, and time.  Skin: Skin is warm and dry. She is not diaphoretic.  Psychiatric: She has a normal mood and affect. Her behavior is normal. Judgment and thought content normal.  Nursing note and vitals reviewed.   ED Course  Procedures (including critical care time)  Labs Review Labs Reviewed  RAPID STREP SCREEN (NOT AT [facility])  CULTURE, GROUP A STREP [facility])    Imaging Review No results found.   Visual Acuity Review  Right Eye Distance:   Left Eye Distance:   Bilateral Distance:    Right Eye Near:   Left Eye Near:    Bilateral Near:         MDM   1. Acute URI    Discharge Medication List as of 08/18/2015 12:00 PM     START taking these medications   Details  azithromycin (ZITHROMAX Z-PAK) 250 MG tablet Use as per package instructions, Normal    fluticasone (FLONASE) 50 MCG/ACT nasal spray Place 2 sprays into both nostrils daily., Starting 08/18/2015, Until Discontinued, Normal      Plan: 1. Test/x-ray results and diagnosis reviewed with patient 2. rx as per orders; risks, benefits, potential side effects reviewed with patient 3. Recommend supportive treatment with rest and fluids. I have asked her to start using a Flonase nasal sprayer and to hold the Z-Pak for a week if she is not improving she may take it at that time. She should follow-up with her primary care if she is not improving or worsening. 4. F/u prn if symptoms worsen or don't improve     Lutricia Feil, PA-C 08/18/15 1219

## 2015-08-18 NOTE — ED Notes (Signed)
Patient started having body aches and fever yesterday that have developed into a sore throat and ear ache.

## 2015-08-20 LAB — CULTURE, GROUP A STREP (THRC)

## 2015-10-06 ENCOUNTER — Other Ambulatory Visit: Payer: Self-pay

## 2015-10-06 DIAGNOSIS — Z1231 Encounter for screening mammogram for malignant neoplasm of breast: Secondary | ICD-10-CM

## 2015-11-13 ENCOUNTER — Ambulatory Visit
Admission: RE | Admit: 2015-11-13 | Discharge: 2015-11-13 | Disposition: A | Discharge status: Home / Self Care | Payer: BLUE CROSS/BLUE SHIELD | Source: Ambulatory Visit

## 2015-11-13 DIAGNOSIS — Z1231 Encounter for screening mammogram for malignant neoplasm of breast: Secondary | ICD-10-CM

## 2016-08-14 ENCOUNTER — Ambulatory Visit
Admission: EM | Admit: 2016-08-14 | Discharge: 2016-08-14 | Disposition: A | Discharge status: Home / Self Care | Payer: BLUE CROSS/BLUE SHIELD | Attending: Emergency Medicine | Admitting: Emergency Medicine

## 2016-08-14 ENCOUNTER — Encounter: Payer: Self-pay | Admitting: Emergency Medicine

## 2016-08-14 DIAGNOSIS — J029 Acute pharyngitis, unspecified: Secondary | ICD-10-CM

## 2016-08-14 LAB — RAPID STREP SCREEN (MED CTR MEBANE ONLY): STREPTOCOCCUS, GROUP A SCREEN (DIRECT): NEGATIVE

## 2016-08-14 MED ORDER — PREDNISONE 20 MG PO TABS: ORAL_TABLET | ORAL | 0 refills | Status: DC

## 2016-08-14 MED ORDER — FLUTICASONE PROPIONATE 50 MCG/ACT NA SUSP: 2.0000 | Freq: Every day | NASAL | 0 refills | Status: DC

## 2016-08-14 MED ORDER — FEXOFENADINE-PSEUDOEPHED ER 60-120 MG PO TB12: 1.0000 | ORAL_TABLET | Freq: Two times a day (BID) | ORAL | 0 refills | Status: DC

## 2016-08-14 NOTE — ED Triage Notes (Signed)
Sore throat, hoarse and ear pain when swallowing for 3 days

## 2016-08-14 NOTE — Discharge Instructions (Signed)
Gargle with salt water 1/2-1 teaspoon per ounce of water necessary for comfort. Use Flonase nasal spray for one month.

## 2016-08-14 NOTE — ED Provider Notes (Signed)
CSN: [id number]     Arrival date & time 08/14/16  [id number] History   First MD Initiated Contact with Patient 08/14/16 0919     Chief Complaint  Patient presents with  . Sore Throat   (Consider location/radiation/quality/duration/timing/severity/associated sxs/prior Treatment) HPI  This a 56 year old female who presents with a three-day history of sore throat hoarseness and ear pain she's had no fever or chills. She has had no respiratory symptoms. Her records she has had a past history of recurrent eustachian tube dysfunction.      Past Medical History:  Diagnosis Date  . GERD (gastroesophageal reflux disease)    Past Surgical History:  Procedure Laterality Date  . GALLBLADDER SURGERY    . tubiligation     No family history on file. Social History  Substance Use Topics  . Smoking status: Never Smoker  . Smokeless tobacco: Never Used  . Alcohol use No   OB History    No data available     Review of Systems  Constitutional: Positive for activity change. Negative for chills, fatigue and fever.  HENT: Positive for congestion, ear pain, sore throat, trouble swallowing and voice change. Negative for ear discharge, postnasal drip, sinus pain and sinus pressure.   Respiratory: Negative for cough and shortness of breath.   All other systems reviewed and are negative.   Allergies  Penicillins  Home Medications   Prior to Admission medications   Medication Sig Start Date End Date Taking? Authorizing Provider  fexofenadine-pseudoephedrine (ALLEGRA-D) 60-120 MG 12 hr tablet Take 1 tablet by mouth every 12 (twelve) hours. 08/14/16   Lutricia FeilWilliam P Roemer, PA-C  fluticasone (FLONASE) 50 MCG/ACT nasal spray Place 2 sprays into both nostrils daily. 08/14/16   Lutricia FeilWilliam P Roemer, PA-C  predniSONE (DELTASONE) 20 MG tablet Take 2 tablets (40 mg) daily by mouth 08/14/16   Lutricia FeilWilliam P Roemer, PA-C   Meds Ordered and Administered this Visit  Medications - No data to display  BP 111/64 (BP Location:  Left Arm)   Pulse 86   Temp 98.2 F (36.8 C) (Oral)   Resp 16   Ht 5\' 7"  (1.702 m)   Wt 198 lb (89.8 kg)   SpO2 98%   BMI 31.01 kg/m  No data found.   Physical Exam  Constitutional: She is oriented to person, place, and time. She appears well-developed and well-nourished. No distress.  HENT:  Head: Normocephalic and atraumatic.  Left Ear: External ear normal.  Nose: Nose normal.  Mouth/Throat: Oropharynx is clear and moist. No oropharyngeal exudate.  Left ear TM is dark and dull  Eyes: EOM are normal. Pupils are equal, round, and reactive to light. Right eye exhibits no discharge. Left eye exhibits no discharge.  Neck: Normal range of motion. Neck supple.  Pulmonary/Chest: Effort normal and breath sounds normal. No respiratory distress. She has no wheezes. She has no rales.  Musculoskeletal: Normal range of motion.  Lymphadenopathy:    She has no cervical adenopathy.  Neurological: She is alert and oriented to person, place, and time.  Skin: Skin is warm and dry. She is not diaphoretic.  Psychiatric: She has a normal mood and affect. Her behavior is normal. Judgment and thought content normal.  Nursing note and vitals reviewed.   Urgent Care Course     Procedures (including critical care time)  Labs Review Labs Reviewed  RAPID STREP SCREEN (NOT AT [facility])  CULTURE, GROUP A STREP [facility])    Imaging Review No results found.   Visual Acuity Review  Right Eye Distance:   Left Eye Distance:   Bilateral Distance:    Right Eye Near:   Left Eye Near:    Bilateral Near:         MDM   1. Viral pharyngitis    Discharge Medication List as of 08/14/2016  9:40 AM    START taking these medications   Details  fexofenadine-pseudoephedrine (ALLEGRA-D) 60-120 MG 12 hr tablet Take 1 tablet by mouth every 12 (twelve) hours., Starting Sat 08/14/2016, Normal    predniSONE (DELTASONE) 20 MG tablet Take 2 tablets (40 mg) daily by mouth, Print      Plan: 1. Test/x-ray  results and diagnosis reviewed with patient 2. rx as per orders; risks, benefits, potential side effects reviewed with patient 3. Recommend supportive treatment with Use of Flonase daily month. Salt water gargles as necessary for comfort. Given her prescription for prednisone if she is not able to reduce her sore throat with salt water gargles and ibuprofen. I've told her this most likely a viral illness and will not require antibiotics. She will call in 48 hours for results of the pharyngeal swab. Patient is allergic to penicillin. 4. F/u prn if symptoms worsen or don't improve     Lutricia Feil, PA-C 08/14/16 [phone]

## 2016-08-16 ENCOUNTER — Ambulatory Visit
Admission: EM | Admit: 2016-08-16 | Discharge: 2016-08-16 | Disposition: A | Discharge status: Home / Self Care | Payer: BLUE CROSS/BLUE SHIELD | Attending: Family Medicine | Admitting: Family Medicine

## 2016-08-16 ENCOUNTER — Encounter: Payer: Self-pay | Admitting: *Deleted

## 2016-08-16 DIAGNOSIS — H6502 Acute serous otitis media, left ear: Secondary | ICD-10-CM

## 2016-08-16 MED ORDER — SULFAMETHOXAZOLE-TRIMETHOPRIM 800-160 MG PO TABS: 1.0000 | ORAL_TABLET | Freq: Two times a day (BID) | ORAL | 0 refills | Status: DC

## 2016-08-16 MED ORDER — FLUCONAZOLE 150 MG PO TABS: ORAL_TABLET | ORAL | 0 refills | Status: DC

## 2016-08-16 NOTE — ED Triage Notes (Signed)
Pt seen here Saturday. Pt states getting much worse.

## 2016-08-16 NOTE — ED Triage Notes (Signed)
Bilat ear pain, left is worse than right. States possible fever now. Denies drainage.

## 2016-08-16 NOTE — ED Provider Notes (Signed)
[facility]    CSN: [id number] Arrival date & time: 08/16/16  1313     History   Chief Complaint Chief Complaint  Patient presents with  . Otalgia    HPI Maureen Gallegos is a 56 y.o. female.    Otalgia  Location:  Bilateral (left greater than right) Behind ear:  No abnormality Quality:  Aching and pressure Severity:  Mild Onset quality:  Sudden Duration:  2 days Timing:  Constant Progression:  Worsening Chronicity:  New Context: recent URI   Context: not direct blow and not foreign body in ear   Relieved by:  Nothing Associated symptoms: congestion   Associated symptoms: no abdominal pain, no diarrhea, no ear discharge, no fever, no rash, no tinnitus and no vomiting   Risk factors: no recent travel, no chronic ear infection and no prior ear surgery     Past Medical History:  Diagnosis Date  . GERD (gastroesophageal reflux disease)     There are no active problems to display for this patient.   Past Surgical History:  Procedure Laterality Date  . GALLBLADDER SURGERY    . tubiligation      OB History    No data available       Home Medications    Prior to Admission medications   Medication Sig Start Date End Date Taking? Authorizing Provider  fexofenadine-pseudoephedrine (ALLEGRA-D) 60-120 MG 12 hr tablet Take 1 tablet by mouth every 12 (twelve) hours. 08/14/16  Yes Lutricia Feil, PA-C  fluticasone (FLONASE) 50 MCG/ACT nasal spray Place 2 sprays into both nostrils daily. 08/14/16  Yes Lutricia Feil, PA-C  predniSONE (DELTASONE) 20 MG tablet Take 2 tablets (40 mg) daily by mouth 08/14/16  Yes Lutricia Feil, PA-C  fluconazole (DIFLUCAN) 150 MG tablet 1 tab po once 08/16/16   Payton Mccallum, MD  sulfamethoxazole-trimethoprim (BACTRIM DS,SEPTRA DS) 800-160 MG tablet Take 1 tablet by mouth 2 (two) times daily. 08/16/16   Payton Mccallum, MD    Family History History reviewed. No pertinent family history.  Social History Social History    Substance Use Topics  . Smoking status: Never Smoker  . Smokeless tobacco: Never Used  . Alcohol use No     Allergies   Penicillins   Review of Systems Review of Systems  Constitutional: Negative for fever.  HENT: Positive for congestion and ear pain. Negative for ear discharge and tinnitus.   Gastrointestinal: Negative for abdominal pain, diarrhea and vomiting.  Skin: Negative for rash.     Physical Exam Triage Vital Signs ED Triage Vitals  Enc Vitals Group     BP 08/16/16 1330 116/67     Pulse Rate 08/16/16 1330 100     Resp 08/16/16 1330 16     Temp 08/16/16 1330 97.8 F (36.6 C)     Temp Source 08/16/16 1330 Oral     SpO2 08/16/16 1330 99 %     Weight 08/16/16 1332 198 lb (89.8 kg)     Height 08/16/16 1332 5\' 7"  (1.702 m)     Head Circumference --      Peak Flow --      Pain Score 08/16/16 1345 6     Pain Loc --      Pain Edu? --      Excl. in GC? --    No data found.   Updated Vital Signs BP 116/67 (BP Location: Left Arm)   Pulse 100   Temp 97.8 F (36.6 C) (Oral)  Resp 16   Ht 5\' 7"  (1.702 m)   Wt 198 lb (89.8 kg)   SpO2 99%   BMI 31.01 kg/m   Visual Acuity Right Eye Distance:   Left Eye Distance:   Bilateral Distance:    Right Eye Near:   Left Eye Near:    Bilateral Near:     Physical Exam  Constitutional: She appears well-developed and well-nourished. No distress.  HENT:  Head: Normocephalic and atraumatic.  Right Ear: External ear and ear canal normal. Tympanic membrane is erythematous.  Left Ear: External ear and ear canal normal. Tympanic membrane is erythematous and bulging. A middle ear effusion is present.  Nose: Rhinorrhea present. No mucosal edema, nose lacerations, sinus tenderness, nasal deformity, septal deviation or nasal septal hematoma. No epistaxis.  No foreign bodies. Right sinus exhibits no maxillary sinus tenderness and no frontal sinus tenderness. Left sinus exhibits no maxillary sinus tenderness and no frontal sinus  tenderness.  Mouth/Throat: Uvula is midline, oropharynx is clear and moist and mucous membranes are normal. No oropharyngeal exudate.  Eyes: Conjunctivae and EOM are normal. Pupils are equal, round, and reactive to light. Right eye exhibits no discharge. Left eye exhibits no discharge. No scleral icterus.  Neck: Normal range of motion. Neck supple. No thyromegaly present.  Cardiovascular: Normal rate.   Pulmonary/Chest: Effort normal. No respiratory distress.  Lymphadenopathy:    She has no cervical adenopathy.  Skin: She is not diaphoretic.  Nursing note and vitals reviewed.    UC Treatments / Results  Labs (all labs ordered are listed, but only abnormal results are displayed) Labs Reviewed - No data to display  EKG  EKG Interpretation None       Radiology No results found.  Procedures Procedures (including critical care time)  Medications Ordered in UC Medications - No data to display   Initial Impression / Assessment and Plan / UC Course  I have reviewed the triage vital signs and the nursing notes.  Pertinent labs & imaging results that were available during my care of the patient were reviewed by me and considered in my medical decision making (see chart for details).       Final Clinical Impressions(s) / UC Diagnoses   Final diagnoses:  Acute serous otitis media of left ear, recurrence not specified    New Prescriptions Discharge Medication List as of 08/16/2016  1:44 PM    START taking these medications   Details  fluconazole (DIFLUCAN) 150 MG tablet 1 tab po once, Normal    sulfamethoxazole-trimethoprim (BACTRIM DS,SEPTRA DS) 800-160 MG tablet Take 1 tablet by mouth 2 (two) times daily., Starting Mon 08/16/2016, Normal       1. diagnosis reviewed with patient 2. rx as per orders above; reviewed possible side effects, interactions, risks and benefits  3. Recommend supportive treatment with otc analgesics prn 4. Follow-up prn if symptoms worsen or  don't improve   Payton Mccallumrlando Tasheena Wambolt, MD 08/16/16 1429

## 2016-08-17 LAB — CULTURE, GROUP A STREP (THRC)

## 2016-10-11 ENCOUNTER — Other Ambulatory Visit: Payer: Self-pay | Admitting: Obstetrics and Gynecology

## 2016-10-11 DIAGNOSIS — Z1231 Encounter for screening mammogram for malignant neoplasm of breast: Secondary | ICD-10-CM

## 2016-11-16 ENCOUNTER — Ambulatory Visit
Admission: RE | Admit: 2016-11-16 | Discharge: 2016-11-16 | Disposition: A | Discharge status: Home / Self Care | Payer: BLUE CROSS/BLUE SHIELD | Source: Ambulatory Visit | Attending: Obstetrics and Gynecology | Admitting: Obstetrics and Gynecology

## 2016-11-16 DIAGNOSIS — Z1231 Encounter for screening mammogram for malignant neoplasm of breast: Secondary | ICD-10-CM

## 2017-11-04 ENCOUNTER — Other Ambulatory Visit: Payer: Self-pay | Admitting: Obstetrics and Gynecology

## 2017-11-04 DIAGNOSIS — Z1231 Encounter for screening mammogram for malignant neoplasm of breast: Secondary | ICD-10-CM

## 2017-11-25 ENCOUNTER — Ambulatory Visit
Admission: RE | Admit: 2017-11-25 | Discharge: 2017-11-25 | Disposition: A | Discharge status: Home / Self Care | Payer: BLUE CROSS/BLUE SHIELD | Source: Ambulatory Visit | Attending: Obstetrics and Gynecology | Admitting: Obstetrics and Gynecology

## 2017-11-25 DIAGNOSIS — Z1231 Encounter for screening mammogram for malignant neoplasm of breast: Secondary | ICD-10-CM

## 2018-05-17 ENCOUNTER — Encounter: Payer: Self-pay | Admitting: Emergency Medicine

## 2018-05-17 ENCOUNTER — Emergency Department
Admission: EM | Admit: 2018-05-17 | Discharge: 2018-05-17 | Disposition: A | Discharge status: Home / Self Care | Payer: BLUE CROSS/BLUE SHIELD | Attending: Emergency Medicine | Admitting: Emergency Medicine

## 2018-05-17 ENCOUNTER — Emergency Department: Payer: BLUE CROSS/BLUE SHIELD

## 2018-05-17 DIAGNOSIS — R0789 Other chest pain: Secondary | ICD-10-CM | POA: Diagnosis not present

## 2018-05-17 DIAGNOSIS — Z79899 Other long term (current) drug therapy: Secondary | ICD-10-CM | POA: Insufficient documentation

## 2018-05-17 DIAGNOSIS — R079 Chest pain, unspecified: Secondary | ICD-10-CM | POA: Diagnosis present

## 2018-05-17 HISTORY — DX: Nonrheumatic pulmonary valve stenosis: I37.0

## 2018-05-17 HISTORY — DX: Cardiac murmur, unspecified: R01.1

## 2018-05-17 LAB — CBC
HEMATOCRIT: 45.3 % (ref 36.0–46.0)
Hemoglobin: 14.9 g/dL (ref 12.0–15.0)
MCH: 28.7 pg (ref 26.0–34.0)
MCHC: 32.9 g/dL (ref 30.0–36.0)
MCV: 87.3 fL (ref 80.0–100.0)
Platelets: 285 10*3/uL (ref 150–400)
RBC: 5.19 MIL/uL — AB (ref 3.87–5.11)
RDW: 13.8 % (ref 11.5–15.5)
WBC: 6.5 10*3/uL (ref 4.0–10.5)
nRBC: 0 % (ref 0.0–0.2)

## 2018-05-17 LAB — PROTIME-INR
INR: 0.94
PROTHROMBIN TIME: 12.5 s (ref 11.4–15.2)

## 2018-05-17 LAB — BASIC METABOLIC PANEL
ANION GAP: 12 (ref 5–15)
BUN: 11 mg/dL (ref 6–20)
CO2: 23 mmol/L (ref 22–32)
Calcium: 9.8 mg/dL (ref 8.9–10.3)
Chloride: 105 mmol/L (ref 98–111)
Creatinine, Ser: 0.72 mg/dL (ref 0.44–1.00)
GFR calc Af Amer: >60 mL/min (ref >60)
GFR calc non Af Amer: >60 mL/min (ref >60)
GLUCOSE: 107 mg/dL — AB (ref 70–99)
Potassium: 3.6 mmol/L (ref 3.5–5.1)
Sodium: 140 mmol/L (ref 135–145)

## 2018-05-17 LAB — TROPONIN I: Troponin I: <0.03 ng/mL (ref <0.03)

## 2018-05-17 MED ORDER — FAMOTIDINE IN NACL 20-0.9 MG/50ML-% IV SOLN
20.0000 mg | Freq: Once | INTRAVENOUS | Status: AC
  Administered 2018-05-17: 20 mg via INTRAVENOUS
  Filled 2018-05-17: qty 50

## 2018-05-17 MED ORDER — CYCLOBENZAPRINE HCL 5 MG PO TABS: 5.0000 mg | ORAL_TABLET | Freq: Three times a day (TID) | ORAL | 0 refills | Status: DC | PRN

## 2018-05-17 MED ORDER — ASPIRIN 325 MG PO TABS
325.0000 mg | ORAL_TABLET | Freq: Once | ORAL | Status: AC
  Administered 2018-05-17: 325 mg via ORAL
  Filled 2018-05-17: qty 1

## 2018-05-17 MED ORDER — CYCLOBENZAPRINE HCL 10 MG PO TABS
5.0000 mg | ORAL_TABLET | Freq: Once | ORAL | Status: AC
  Administered 2018-05-17: 5 mg via ORAL
  Filled 2018-05-17: qty 1

## 2018-05-17 NOTE — ED Provider Notes (Signed)
Patient's second troponin negative. States that she continues to feel better. She currently does not have a primary care doctor so did encourage patient to establish care with doctor.    Phineas SemenGoodman, Maureen Marrin, MD 05/17/18 [phone]

## 2018-05-17 NOTE — ED Notes (Signed)
ED Provider at bedside. 

## 2018-05-17 NOTE — ED Notes (Signed)
Pt assisted to bedside commode. Peri care performed independently.

## 2018-05-17 NOTE — Discharge Instructions (Signed)
Follow up with cardiology.   Take motrin for pain   Take flexeril for muscle spasms   See your doctor.   Return to ER if you have worse chest pain, shortness of breath.

## 2018-05-17 NOTE — ED Provider Notes (Signed)
[facility] EMERGENCY DEPARTMENT Provider Note   CSN: [id number] Arrival date & time: 05/17/18  1205     History   Chief Complaint Chief Complaint  Patient presents with  . Chest Pain    HPI Maureen Gallegos is a 57 y.o. female history of reflux, pulmonary stenosis, here presenting with chest pain.  Patient states that she did pick up some heavy box aches yesterday.  She woke up this morning with bilateral upper chest pain that radiated to her neck and down bilateral arms.  Denies any shortness of breath or pleuritic chest pain.  Denies any recent travel or history of CAD.   The history is provided by the patient.    Past Medical History:  Diagnosis Date  . GERD (gastroesophageal reflux disease)   . Heart murmur   . Pulmonary stenosis    as a child    There are no active problems to display for this patient.   Past Surgical History:  Procedure Laterality Date  . AUGMENTATION MAMMAPLASTY Bilateral   . GALLBLADDER SURGERY    . tubiligation       OB History   None      Home Medications    Prior to Admission medications   Medication Sig Start Date End Date Taking? Authorizing Provider  Ascorbic Acid (VITAMIN C) 1000 MG tablet Take 1,000 mg by mouth daily.   Yes [provider]  Cholecalciferol (VITAMIN D3) 1.25 MG (50000 UT) TABS Take 5,000 Units by mouth every evening.   Yes [provider]  esomeprazole (NEXIUM) 40 MG capsule Take 40 mg by mouth 2 (two) times daily. 02/11/16  Yes [provider]  fluconazole (DIFLUCAN) 150 MG tablet 1 tab po once 08/16/16  Yes Conty, Orlando, MD  vitamin B-12 (CYANOCOBALAMIN) 100 MCG tablet Take 100 mcg by mouth daily.   Yes [provider]    Family History No family history on file.  Social History Social History   Tobacco Use  . Smoking status: Never Smoker  . Smokeless tobacco: Never Used  Substance Use Topics  . Alcohol use: No  . Drug use: No      Allergies   Penicillins   Review of Systems Review of Systems  Cardiovascular: Positive for chest pain.  All other systems reviewed and are negative.    Physical Exam Updated Vital Signs BP 122/79   Pulse 74   Temp 98.4 F (36.9 C) (Oral)   Resp 12   Ht 5\' 7"  (1.702 m)   Wt 111.1 kg   SpO2 97%   BMI 38.37 kg/m   Physical Exam  Constitutional: She is oriented to person, place, and time. She appears well-developed and well-nourished.  HENT:  Head: Normocephalic.  Eyes: Pupils are equal, round, and reactive to light. EOM are normal.  Neck: Normal range of motion. Neck supple.  Cardiovascular: Normal rate, regular rhythm and normal pulses.  Pulmonary/Chest: Effort normal and breath sounds normal.  Reproducible upper chest tenderness   Abdominal: Soft. Bowel sounds are normal.  Musculoskeletal: Normal range of motion.       Right lower leg: Normal.       Left lower leg: Normal.  Neurological: She is alert and oriented to person, place, and time.  Skin: Skin is warm. Capillary refill takes less than 2 seconds.  Psychiatric: She has a normal mood and affect. Her behavior is normal.  Nursing note and vitals reviewed.    ED Treatments / Results  Labs (all  labs ordered are listed, but only abnormal results are displayed) Labs Reviewed  BASIC METABOLIC PANEL - Abnormal; Notable for the following components:      Result Value   Glucose, Bld 107 (*)    All other components within normal limits  CBC - Abnormal; Notable for the following components:   RBC 5.19 (*)    All other components within normal limits  TROPONIN I  PROTIME-INR  TROPONIN I    EKG EKG Interpretation  Date/Time:  Wednesday May 17 2018 12:11:05 EST Ventricular Rate:  90 PR Interval:  146 QRS Duration: 110 QT Interval:  368 QTC Calculation: 450 R Axis:   -39 Text Interpretation:  Normal sinus rhythm with sinus arrhythmia Left axis deviation Pulmonary disease pattern Incomplete  right bundle branch block Left ventricular hypertrophy with repolarization abnormality Abnormal ECG When compared with ECG of 09-Jun-2009 00:49, Premature ventricular complexes are no longer Present Confirmed by Richardean CanalYao, David H [phone]([id number]) on 05/17/2018 12:23:11 PM   Radiology Dg Chest 2 View  Result Date: 05/17/2018 CLINICAL DATA:  Chest pain. EXAM: CHEST - 2 VIEW COMPARISON:  Chest two-view 01/26/2013, chest CT 07/15/2009 FINDINGS: Part size normal. Enlarged left pulmonary artery as noted on CT. Aortic arch on the left is normal. Normal vascularity. Lungs are clear without infiltrate effusion or mass. IMPRESSION: No active cardiopulmonary disease. Chronic enlargement left pulmonary artery. Electronically Signed   By: Marlan Palauharles  Clark M.D.   On: 05/17/2018 12:52    Procedures Procedures (including critical care time)  Medications Ordered in ED Medications  famotidine (PEPCID) IVPB 20 mg premix ( Intravenous Stopped 05/17/18 1355)  aspirin tablet 325 mg (325 mg Oral Given 05/17/18 1321)  cyclobenzaprine (FLEXERIL) tablet 5 mg (5 mg Oral Given 05/17/18 1321)     Initial Impression / Assessment and Plan / ED Course  I have reviewed the triage vital signs and the nursing notes.  Pertinent labs & imaging results that were available during my care of the patient were reviewed by me and considered in my medical decision making (see chart for details).    Maureen Gallegos is a 57 y.o. female here with chest pain. She did pick up heavy boxes yesterday. Likely muscle strain. Low suspicion for ACS, I doubt dissection or PE. Will get labs, trop x 2, CXR. Will give motrin, flexeril.   3:33 PM Patient' initial trop neg. EKG unremarkable. Second trop at 3:30 pm. If negative, anticipate dc home. Signed out to Dr. Derrill KayGoodman.    Final Clinical Impressions(s) / ED Diagnoses   Final diagnoses:  None    ED Discharge Orders    None       Charlynne PanderYao, David Hsienta, MD 05/17/18 [phone]

## 2018-05-17 NOTE — ED Triage Notes (Signed)
Patient presents to the ED with upper chest pain radiating into her neck that began last night.  Patient states chest pain has been becoming more uncomfortable as time has gone on.  Patient reports nauseous and anxious.  Denies shortness of breath.

## 2018-12-06 ENCOUNTER — Other Ambulatory Visit: Payer: Self-pay | Admitting: Obstetrics and Gynecology

## 2018-12-06 DIAGNOSIS — Z1231 Encounter for screening mammogram for malignant neoplasm of breast: Secondary | ICD-10-CM

## 2019-01-19 ENCOUNTER — Other Ambulatory Visit: Payer: Self-pay

## 2019-01-19 ENCOUNTER — Ambulatory Visit
Admission: RE | Admit: 2019-01-19 | Discharge: 2019-01-19 | Disposition: A | Discharge status: Home / Self Care | Payer: BLUE CROSS/BLUE SHIELD | Source: Ambulatory Visit | Attending: Obstetrics and Gynecology | Admitting: Obstetrics and Gynecology

## 2019-01-19 DIAGNOSIS — Z1231 Encounter for screening mammogram for malignant neoplasm of breast: Secondary | ICD-10-CM

## 2019-04-02 ENCOUNTER — Other Ambulatory Visit: Payer: Self-pay

## 2019-04-02 DIAGNOSIS — Z20822 Contact with and (suspected) exposure to covid-19: Secondary | ICD-10-CM

## 2019-04-03 LAB — NOVEL CORONAVIRUS, NAA: SARS-CoV-2, NAA: NOT DETECTED

## 2019-04-25 DIAGNOSIS — K219 Gastro-esophageal reflux disease without esophagitis: Secondary | ICD-10-CM | POA: Insufficient documentation

## 2019-05-02 DIAGNOSIS — I38 Endocarditis, valve unspecified: Secondary | ICD-10-CM | POA: Insufficient documentation

## 2019-12-17 ENCOUNTER — Other Ambulatory Visit: Payer: Self-pay | Admitting: *Deleted

## 2019-12-17 DIAGNOSIS — N390 Urinary tract infection, site not specified: Secondary | ICD-10-CM

## 2019-12-18 ENCOUNTER — Other Ambulatory Visit: Payer: Self-pay

## 2019-12-18 ENCOUNTER — Other Ambulatory Visit
Admission: RE | Admit: 2019-12-18 | Discharge: 2019-12-18 | Disposition: A | Discharge status: Home / Self Care | Payer: BC Managed Care – PPO | Attending: Urology | Admitting: Urology

## 2019-12-18 ENCOUNTER — Encounter: Payer: Self-pay | Admitting: Urology

## 2019-12-18 ENCOUNTER — Ambulatory Visit (INDEPENDENT_AMBULATORY_CARE_PROVIDER_SITE_OTHER): Payer: BC Managed Care – PPO | Admitting: Urology

## 2019-12-18 VITALS — BP 133/87 | HR 88 | Ht 67.0 in | Wt 259.0 lb

## 2019-12-18 DIAGNOSIS — N390 Urinary tract infection, site not specified: Secondary | ICD-10-CM | POA: Diagnosis present

## 2019-12-18 DIAGNOSIS — R3121 Asymptomatic microscopic hematuria: Secondary | ICD-10-CM

## 2019-12-18 LAB — URINALYSIS, COMPLETE (UACMP) WITH MICROSCOPIC
Bacteria, UA: NONE SEEN
Bilirubin Urine: NEGATIVE
Glucose, UA: NEGATIVE mg/dL
Hgb urine dipstick: NEGATIVE
Ketones, ur: NEGATIVE mg/dL
Nitrite: NEGATIVE
Protein, ur: NEGATIVE mg/dL
RBC / HPF: NONE SEEN RBC/hpf (ref 0–5)
Specific Gravity, Urine: 1.025 (ref 1.005–1.030)
pH: 6.5 (ref 5.0–8.0)

## 2019-12-18 LAB — BLADDER SCAN AMB NON-IMAGING

## 2019-12-18 NOTE — Progress Notes (Signed)
12/18/19 2:13 PM   Maureen Gallegos Jan 29, 1961 [id number]  CC: microscopic hematuria, hx of rUTI  HPI: I saw Maureen Gallegos in urology clinic today for evaluation of microscopic hematuria and history of recurrent UTI.  She is a 59 year old healthy female with no prior smoking history and no carcinogenic exposures who was recently found to have 4-10 RBCs on urinalysis.  A urinalysis was performed for some abnormal sensation with urination, but no distinct burning, urgency, or frequency.  Urine culture was negative.  She has been on Bactrim by a different provider for 3 to 4 years for history of UTIs.  She denies any UTIs while on the low-dose antibiotic.  She denies any history of gross hematuria.  On reviewing her prior urinalyses, she does have a history of microscopic hematuria with 4-10 RBCs seen on a urine sample from 2016.  She denies any family history of bladder or kidney cancer.  Urinalysis today is benign with 6-10 squamous cells, 20 WBCs, 0 bacteria, 0 RBCs.  PVR is normal today at 110 mL.  PMH: Past Medical History:  Diagnosis Date  . GERD (gastroesophageal reflux disease)   . Heart murmur   . Pulmonary stenosis    as a child    Surgical History: Past Surgical History:  Procedure Laterality Date  . AUGMENTATION MAMMAPLASTY Bilateral   . GALLBLADDER SURGERY    . tubiligation      Family History: No family history on file.  Social History:  reports that she has never smoked. She has never used smokeless tobacco. She reports that she does not drink alcohol and does not use drugs.  Physical Exam: BP 133/87   Pulse 88   Ht 5\' 7"  (1.702 m)   Wt 259 lb (117.5 kg)   BMI 40.57 kg/m    Constitutional:  Alert and oriented, No acute distress. Cardiovascular: No clubbing, cyanosis, or edema. Respiratory: Normal respiratory effort, no increased work of breathing. GI: Abdomen is soft, nontender, nondistended, no abdominal masses  Laboratory Data: Reviewed, see  HPI  Pertinent Imaging: None to review  Assessment & Plan:   In summary, she is a 59 year old female with a reported history of recurrent UTIs, who has been on low-dose Bactrim daily for 3 to 4 years without any infections.  She also has microscopic hematuria with 4-10 RBCs that was seen in 2016, as well as recently in June 2021.  She denies any history of gross hematuria or carcinogenic risk factors.  Regarding her daily Bactrim, I recommended discontinuing this medication and transitioning to daily cranberry tablet prophylaxis.  We discussed that if she developed recurrent infections despite cranberry tablets, we could always consider restarting the Bactrim, however we typically do not prescribe long-term antibiotic prophylaxis over 1 year except in rare cases.  Regarding her asymptomatic microscopic hematuria, we reviewed the new AUA guidelines regarding the low, intermediate, and high risk categories, as well as risk factors for urothelial cell carcinoma.  Her history is certainly reassuring, and she also had microscopic hematuria in 2016.  We discussed options including observation with close follow-up and repeat urinalysis in 6 months, ultrasound, ultrasound and cystoscopy, or CT urogram and cystoscopy.  She would like to pursue observation which is very reasonable.  If repeat urinalysis in 6 months has persistent microscopic hematuria, would recommend starting with renal ultrasound for work-up with her low risk.  Discontinue Bactrim, cranberry tablets daily for UTI prophylaxis RTC 6 months with urinalysis prior   2017, MD 12/18/2019  [facility] [address], [place], [place] [address] [phone]

## 2019-12-18 NOTE — Patient Instructions (Signed)
Stop Bactrim, start cranberry tablets daily for UTI prophylaxis  Let us know if you see any blood in the urine, or develop UTIs off the antibiotics

## 2019-12-27 ENCOUNTER — Other Ambulatory Visit: Payer: Self-pay | Admitting: Internal Medicine

## 2019-12-27 DIAGNOSIS — Z1231 Encounter for screening mammogram for malignant neoplasm of breast: Secondary | ICD-10-CM

## 2020-01-21 ENCOUNTER — Other Ambulatory Visit: Payer: Self-pay

## 2020-01-21 ENCOUNTER — Ambulatory Visit
Admission: RE | Admit: 2020-01-21 | Discharge: 2020-01-21 | Disposition: A | Discharge status: Home / Self Care | Payer: BC Managed Care – PPO | Source: Ambulatory Visit | Attending: Internal Medicine | Admitting: Internal Medicine

## 2020-01-21 DIAGNOSIS — Z1231 Encounter for screening mammogram for malignant neoplasm of breast: Secondary | ICD-10-CM

## 2020-07-15 ENCOUNTER — Ambulatory Visit: Payer: BC Managed Care – PPO | Admitting: Urology

## 2020-12-18 ENCOUNTER — Other Ambulatory Visit: Payer: Self-pay | Admitting: Internal Medicine

## 2020-12-18 DIAGNOSIS — Z1231 Encounter for screening mammogram for malignant neoplasm of breast: Secondary | ICD-10-CM

## 2021-02-10 ENCOUNTER — Ambulatory Visit
Admission: RE | Admit: 2021-02-10 | Discharge: 2021-02-10 | Disposition: A | Discharge status: Home / Self Care | Payer: BC Managed Care – PPO | Source: Ambulatory Visit | Attending: Internal Medicine | Admitting: Internal Medicine

## 2021-02-10 ENCOUNTER — Other Ambulatory Visit: Payer: Self-pay

## 2021-02-10 DIAGNOSIS — Z1231 Encounter for screening mammogram for malignant neoplasm of breast: Secondary | ICD-10-CM

## 2021-04-21 LAB — HM COLONOSCOPY

## 2021-07-05 ENCOUNTER — Other Ambulatory Visit: Payer: Self-pay

## 2021-07-05 ENCOUNTER — Ambulatory Visit
Admission: RE | Admit: 2021-07-05 | Discharge: 2021-07-05 | Disposition: A | Discharge status: Home / Self Care | Payer: BC Managed Care – PPO | Source: Ambulatory Visit | Attending: Emergency Medicine | Admitting: Emergency Medicine

## 2021-07-05 VITALS — BP 117/77 | HR 81 | Temp 98.4°F | Resp 18

## 2021-07-05 DIAGNOSIS — B372 Candidiasis of skin and nail: Secondary | ICD-10-CM | POA: Diagnosis not present

## 2021-07-05 MED ORDER — NYSTATIN 100000 UNIT/GM EX CREA: TOPICAL_CREAM | CUTANEOUS | 1 refills | Status: DC

## 2021-07-05 NOTE — ED Provider Notes (Signed)
Roderic Palau    CSN: [id number] Arrival date & time: 07/05/21  0854      History   Chief Complaint Chief Complaint  Patient presents with   Rash    HPI Maureen Gallegos is a 61 y.o. female.  Patient presents with a tender red rash under her left breast that she noticed yesterday.  No open wounds or drainage.  No fever, chills, sore throat, cough, shortness of breath, or other symptoms.  No treatments at home.  No history of similar rash.    The history is provided by the patient and medical records.   Past Medical History:  Diagnosis Date   GERD (gastroesophageal reflux disease)    Heart murmur    Pulmonary stenosis    as a child    There are no problems to display for this patient.   Past Surgical History:  Procedure Laterality Date   AUGMENTATION MAMMAPLASTY Bilateral    GALLBLADDER SURGERY     tubiligation      OB History   No obstetric history on file.      Home Medications    Prior to Admission medications   Medication Sig Start Date End Date Taking? Authorizing Provider  nystatin cream (MYCOSTATIN) Apply to affected area 2 times daily for 14 days. 07/05/21  Yes Sharion Balloon, NP  Apoaequorin (PREVAGEN) 10 MG CAPS Take by mouth.    [provider]  Ascorbic Acid (VITAMIN C) 1000 MG tablet Take 1,000 mg by mouth daily.    [provider]  celecoxib (CELEBREX) 200 MG capsule Take 200 mg by mouth daily.  06/27/19   [provider]  Cholecalciferol (VITAMIN D3) 1.25 MG (50000 UT) TABS Take 5,000 Units by mouth every evening.    [provider]  esomeprazole (NEXIUM) 20 MG capsule Take 20 mg by mouth daily at 12 noon.    [provider]  sulfamethoxazole-trimethoprim (BACTRIM DS) 800-160 MG tablet Take 1 tablet by mouth daily. 12/11/19   [provider]  vitamin B-12 (CYANOCOBALAMIN) 100 MCG tablet Take 100 mcg by mouth daily.    [provider]    Family History History reviewed. No  pertinent family history.  Social History Social History   Tobacco Use   Smoking status: Never   Smokeless tobacco: Never  Substance Use Topics   Alcohol use: No   Drug use: No     Allergies   Penicillins   Review of Systems Review of Systems  Constitutional:  Negative for chills and fever.  HENT:  Negative for ear pain and sore throat.   Respiratory:  Negative for cough and shortness of breath.   Cardiovascular:  Negative for chest pain and palpitations.  Skin:  Positive for color change and rash.  All other systems reviewed and are negative.   Physical Exam Triage Vital Signs ED Triage Vitals  Enc Vitals Group     BP 07/05/21 0909 117/77     Pulse Rate 07/05/21 0909 81     Resp 07/05/21 0909 18     Temp 07/05/21 0909 98.4 F (36.9 C)     Temp src --      SpO2 07/05/21 0909 97 %     Weight --      Height --      Head Circumference --      Peak Flow --      Pain Score 07/05/21 0920 4     Pain Loc --  Pain Edu? --      Excl. in [place]? --    No data found.  Updated Vital Signs BP 117/77 (BP Location: Left Arm)    Pulse 81    Temp 98.4 F (36.9 C)    Resp 18    SpO2 97%   Visual Acuity Right Eye Distance:   Left Eye Distance:   Bilateral Distance:    Right Eye Near:   Left Eye Near:    Bilateral Near:     Physical Exam Vitals and nursing note reviewed.  Constitutional:      General: She is not in acute distress.    Appearance: She is well-developed. She is not ill-appearing.  HENT:     Mouth/Throat:     Mouth: Mucous membranes are moist.  Cardiovascular:     Rate and Rhythm: Normal rate and regular rhythm.     Heart sounds: Normal heart sounds.  Pulmonary:     Effort: Pulmonary effort is normal. No respiratory distress.     Breath sounds: Normal breath sounds.  Abdominal:     Palpations: Abdomen is soft.     Tenderness: There is no abdominal tenderness.  Musculoskeletal:     Cervical back: Neck supple.  Skin:    General: Skin is warm  and dry.     Findings: Rash present.     Comments: Erythematous rash under left breast.  No open wounds or drainage.   Neurological:     Mental Status: She is alert.  Psychiatric:        Mood and Affect: Mood normal.        Behavior: Behavior normal.     UC Treatments / Results  Labs (all labs ordered are listed, but only abnormal results are displayed) Labs Reviewed - No data to display  EKG   Radiology No results found.  Procedures Procedures (including critical care time)  Medications Ordered in UC Medications - No data to display  Initial Impression / Assessment and Plan / UC Course  I have reviewed the triage vital signs and the nursing notes.  Pertinent labs & imaging results that were available during my care of the patient were reviewed by me and considered in my medical decision making (see chart for details).    Candidal dermatitis.  Treating with Nystatin cream.  Education provided on skin yeast infection.  Instructed patient to follow up with her PCP if not improving.  She agrees to plan of care.   Final Clinical Impressions(s) / UC Diagnoses   Final diagnoses:  Candidal dermatitis     Discharge Instructions      Use the Nystatin cream as directed.  Follow up with your primary care provider if your symptoms are not improving.         ED Prescriptions     Medication Sig Dispense Auth. Provider   nystatin cream (MYCOSTATIN) Apply to affected area 2 times daily for 14 days. 30 g Sharion Balloon, NP      PDMP not reviewed this encounter.   Sharion Balloon, NP 07/05/21 [phone]

## 2021-07-05 NOTE — Discharge Instructions (Addendum)
Use the Nystatin cream as directed.  Follow up with your primary care provider if your symptoms are not improving.    

## 2021-07-05 NOTE — ED Triage Notes (Signed)
Pt here with large, circular, blistering rash under left breast since yesterday. Pt describes the pain as burning sensation.

## 2021-07-20 DIAGNOSIS — E785 Hyperlipidemia, unspecified: Secondary | ICD-10-CM | POA: Insufficient documentation

## 2021-11-03 ENCOUNTER — Other Ambulatory Visit: Payer: Self-pay | Admitting: Internal Medicine

## 2021-11-03 DIAGNOSIS — Z1231 Encounter for screening mammogram for malignant neoplasm of breast: Secondary | ICD-10-CM

## 2021-12-07 ENCOUNTER — Ambulatory Visit
Admission: RE | Admit: 2021-12-07 | Discharge: 2021-12-07 | Disposition: A | Discharge status: Home / Self Care | Payer: BC Managed Care – PPO | Source: Ambulatory Visit | Attending: Family Medicine | Admitting: Family Medicine

## 2021-12-07 VITALS — BP 111/76 | HR 103 | Temp 99.5°F | Resp 18

## 2021-12-07 DIAGNOSIS — J0140 Acute pansinusitis, unspecified: Secondary | ICD-10-CM

## 2021-12-07 MED ORDER — SULFAMETHOXAZOLE-TRIMETHOPRIM 800-160 MG PO TABS: 1.0000 | ORAL_TABLET | Freq: Two times a day (BID) | ORAL | 0 refills | Status: DC

## 2021-12-07 MED ORDER — FLUCONAZOLE 150 MG PO TABS: 150.0000 mg | ORAL_TABLET | Freq: Once | ORAL | 0 refills | Status: AC

## 2021-12-07 MED ORDER — IPRATROPIUM BROMIDE 0.03 % NA SOLN: 2.0000 | Freq: Two times a day (BID) | NASAL | 0 refills | Status: DC | PRN

## 2021-12-07 NOTE — ED Triage Notes (Signed)
Pt presents with sinus pressure, HA, congestion x 3 days.

## 2021-12-07 NOTE — ED Provider Notes (Signed)
[name]    CSN: [id number] Arrival date & time: 12/07/21  1139      History   Chief Complaint Chief Complaint  Patient presents with   Headache    Sinus headache, pain in my face, ear pain (both ears), stuffy nose. - Entered by patient   Sinus Pressure    HPI Maureen Gallegos is a 61 y.o. female.   HPI Patient presents for evaluation of sinus headache, facial pressure and fullness of both ears and stuffy nose. She has an occasional cough due to post nasal drainage. She is afebrile. Endorses teeth pain due to pressure and puffiness and darkened lines under both eyelids. She has taken OTC medication without relief of symptoms. Past Medical History:  Diagnosis Date   GERD (gastroesophageal reflux disease)    Heart murmur    Pulmonary stenosis    as a child    There are no problems to display for this patient.   Past Surgical History:  Procedure Laterality Date   AUGMENTATION MAMMAPLASTY Bilateral    GALLBLADDER SURGERY     tubiligation      OB History   No obstetric history on file.      Home Medications    Prior to Admission medications   Medication Sig Start Date End Date Taking? Authorizing Provider  esomeprazole (NEXIUM) 20 MG capsule Take 20 mg by mouth daily at 12 noon.   Yes [provider]  sulfamethoxazole-trimethoprim (BACTRIM DS) 800-160 MG tablet Take 1 tablet by mouth daily. 12/11/19  Yes [provider]  Apoaequorin (PREVAGEN) 10 MG CAPS Take by mouth.    [provider]  Ascorbic Acid (VITAMIN C) 1000 MG tablet Take 1,000 mg by mouth daily.    [provider]  celecoxib (CELEBREX) 200 MG capsule Take 200 mg by mouth daily.  06/27/19   [provider]  Cholecalciferol (VITAMIN D3) 1.25 MG (50000 UT) TABS Take 5,000 Units by mouth every evening.    [provider]  nystatin cream (MYCOSTATIN) Apply to affected area 2 times daily for 14 days. 07/05/21   Mickie Bail, NP  vitamin B-12  (CYANOCOBALAMIN) 100 MCG tablet Take 100 mcg by mouth daily.    [provider]    Family History No family history on file.  Social History Social History   Tobacco Use   Smoking status: Never   Smokeless tobacco: Never  Vaping Use   Vaping Use: Never used  Substance Use Topics   Alcohol use: No   Drug use: No     Allergies   Penicillins Review of Systems Review of Systems Pertinent negatives listed in HPI  Physical Exam Triage Vital Signs ED Triage Vitals  Enc Vitals Group     BP 12/07/21 1218 111/76     Pulse Rate 12/07/21 1218 (!) 103     Resp 12/07/21 1218 18     Temp 12/07/21 1218 99.5 F (37.5 C)     Temp Source 12/07/21 1218 Oral     SpO2 12/07/21 1218 94 %     Weight --      Height --      Head Circumference --      Peak Flow --      Pain Score 12/07/21 1215 8     Pain Loc --      Pain Edu? --      Excl. in [place]? --    No data found.  Updated Vital Signs BP 111/76 (BP  Location: Left Arm)   Pulse (!) 103   Temp 99.5 F (37.5 C) (Oral)   Resp 18   SpO2 94%   Visual Acuity Right Eye Distance:   Left Eye Distance:   Bilateral Distance:    Right Eye Near:   Left Eye Near:    Bilateral Near:     Physical Exam Constitutional:      Appearance: She is well-developed.  HENT:     Head: Normocephalic.     Nose: Nasal tenderness, mucosal edema and congestion present.     Right Turbinates: Enlarged.     Left Turbinates: Enlarged.     Right Sinus: Maxillary sinus tenderness and frontal sinus tenderness present.     Left Sinus: Maxillary sinus tenderness and frontal sinus tenderness present.  Eyes:     Extraocular Movements: Extraocular movements intact.     Pupils: Pupils are equal, round, and reactive to light.  Cardiovascular:     Rate and Rhythm: Normal rate and regular rhythm.  Pulmonary:     Effort: Pulmonary effort is normal.     Breath sounds: Normal breath sounds.  Skin:    General: Skin is warm and dry.     Capillary  Refill: Capillary refill takes less than 2 seconds.  Neurological:     General: No focal deficit present.     Mental Status: She is alert and oriented to person, place, and time.     GCS: GCS eye subscore is 4. GCS verbal subscore is 5. GCS motor subscore is 6.  Psychiatric:        Mood and Affect: Mood normal.        Behavior: Behavior normal.    UC Treatments / Results  Labs (all labs ordered are listed, but only abnormal results are displayed) Labs Reviewed - No data to display  EKG   Radiology No results found.  Procedures Procedures (including critical care time)  Medications Ordered in UC Medications - No data to display  Initial Impression / Assessment and Plan / UC Course  I have reviewed the triage vital signs and the nursing notes.  Pertinent labs & imaging results that were available during my care of the patient were reviewed by me and considered in my medical decision making (see chart for details).    Acute Sinusitis Treatment per discharge medication orders. Follow-up with PCP or return if symptoms worsen or do not improve. Final Clinical Impressions(s) / UC Diagnoses   Final diagnoses:  Acute non-recurrent pansinusitis   Discharge Instructions   None    ED Prescriptions     Medication Sig Dispense Auth. Provider   sulfamethoxazole-trimethoprim (BACTRIM DS) 800-160 MG tablet Take 1 tablet by mouth 2 (two) times daily. 20 tablet Bing Neighbors, FNP   fluconazole (DIFLUCAN) 150 MG tablet Take 1 tablet (150 mg total) by mouth once for 1 dose. Repeat if needed 2 tablet Bing Neighbors, FNP   ipratropium (ATROVENT) 0.03 % nasal spray Place 2 sprays into both nostrils 2 (two) times daily as needed for rhinitis. 30 mL Bing Neighbors, FNP      PDMP not reviewed this encounter.   Bing Neighbors, [place] 12/08/21 [phone]

## 2022-02-11 ENCOUNTER — Ambulatory Visit
Admission: RE | Admit: 2022-02-11 | Discharge: 2022-02-11 | Disposition: A | Discharge status: Home / Self Care | Payer: BC Managed Care – PPO | Source: Ambulatory Visit | Attending: Internal Medicine | Admitting: Internal Medicine

## 2022-02-11 DIAGNOSIS — Z1231 Encounter for screening mammogram for malignant neoplasm of breast: Secondary | ICD-10-CM

## 2022-06-16 ENCOUNTER — Ambulatory Visit
Admission: EM | Admit: 2022-06-16 | Discharge: 2022-06-16 | Disposition: A | Discharge status: Home / Self Care | Payer: BC Managed Care – PPO

## 2022-06-16 DIAGNOSIS — H0289 Other specified disorders of eyelid: Secondary | ICD-10-CM | POA: Diagnosis not present

## 2022-06-16 DIAGNOSIS — H02846 Edema of left eye, unspecified eyelid: Secondary | ICD-10-CM

## 2022-06-16 MED ORDER — POLYMYXIN B-TRIMETHOPRIM 10000-0.1 UNIT/ML-% OP SOLN: 1.0000 [drp] | Freq: Four times a day (QID) | OPHTHALMIC | 0 refills | Status: AC

## 2022-06-16 NOTE — Discharge Instructions (Signed)
Follow up here or with your primary care provider if your symptoms are worsening or not improving.     

## 2022-06-16 NOTE — ED Triage Notes (Signed)
Pt. Presents to UC w/c/o left eye lid swelling and left ear pain that started 2 days ago. Pt. Has been using Erythromycin opthalmic eyedrops for symptom control

## 2022-06-16 NOTE — ED Provider Notes (Signed)
[name]    CSN: [id number] Arrival date & time: 06/16/22  1649      History   Chief Complaint Chief Complaint  Patient presents with   Eye Problem    Swelling upper lid and underneath (left eye) - Entered by patient    HPI Maureen Gallegos is a 61 y.o. female.    Eye Problem   Presents to urgent care with complaint of left upper eyelid swelling and left ear pain starting 2 days ago she states she has been using erythromycin ophthalmic eyedrops.  Past Medical History:  Diagnosis Date   GERD (gastroesophageal reflux disease)    Heart murmur    Pulmonary stenosis    as a child    There are no problems to display for this patient.   Past Surgical History:  Procedure Laterality Date   AUGMENTATION MAMMAPLASTY Bilateral    over 20 years   GALLBLADDER SURGERY     tubiligation      OB History   No obstetric history on file.      Home Medications    Prior to Admission medications   Medication Sig Start Date End Date Taking? Authorizing Provider  Apoaequorin (PREVAGEN) 10 MG CAPS Take by mouth.    [provider]  Ascorbic Acid (VITAMIN C) 1000 MG tablet Take 1,000 mg by mouth daily.    [provider]  celecoxib (CELEBREX) 200 MG capsule Take 200 mg by mouth daily.  06/27/19   [provider]  Cholecalciferol (VITAMIN D3) 1.25 MG (50000 UT) TABS Take 5,000 Units by mouth every evening.    [provider]  esomeprazole (NEXIUM) 20 MG capsule Take 20 mg by mouth daily at 12 noon.    [provider]  ipratropium (ATROVENT) 0.03 % nasal spray Place 2 sprays into both nostrils 2 (two) times daily as needed for rhinitis. 12/07/21   Bing Neighbors, FNP  nystatin cream (MYCOSTATIN) Apply to affected area 2 times daily for 14 days. 07/05/21   Mickie Bail, NP  sulfamethoxazole-trimethoprim (BACTRIM DS) 800-160 MG tablet Take 1 tablet by mouth 2 (two) times daily. 12/07/21   Bing Neighbors, FNP  vitamin B-12  (CYANOCOBALAMIN) 100 MCG tablet Take 100 mcg by mouth daily.    [provider]    Family History Family History  Problem Relation Age of Onset   Breast cancer Neg Hx     Social History Social History   Tobacco Use   Smoking status: Never   Smokeless tobacco: Never  Vaping Use   Vaping Use: Never used  Substance Use Topics   Alcohol use: No   Drug use: No     Allergies   Penicillins   Review of Systems Review of Systems   Physical Exam Triage Vital Signs ED Triage Vitals  Enc Vitals Group     BP      Pulse      Resp      Temp      Temp src      SpO2      Weight      Height      Head Circumference      Peak Flow      Pain Score      Pain Loc      Pain Edu?      Excl. in [place]?    No data found.  Updated Vital Signs There were no vitals taken for this visit.  Visual Acuity  Right Eye Distance:   Left Eye Distance:   Bilateral Distance:    Right Eye Near:   Left Eye Near:    Bilateral Near:     Physical Exam Vitals reviewed.  Constitutional:      Appearance: Normal appearance.  Eyes:     General:        Left eye: No hordeolum.   Skin:    General: Skin is warm and dry.  Neurological:     General: No focal deficit present.     Mental Status: She is alert and oriented to person, place, and time.  Psychiatric:        Mood and Affect: Mood normal.        Behavior: Behavior normal.      UC Treatments / Results  Labs (all labs ordered are listed, but only abnormal results are displayed) Labs Reviewed - No data to display  EKG   Radiology No results found.  Procedures Procedures (including critical care time)  Medications Ordered in UC Medications - No data to display  Initial Impression / Assessment and Plan / UC Course  I have reviewed the triage vital signs and the nursing notes.  Pertinent labs & imaging results that were available during my care of the patient were reviewed by me and considered in my medical  decision making (see chart for details).   Possibly blepharitis of the left upper eyelid versus hordeolum though no hordeolum is noted.  Single meibomian gland plug is observed but does not appear to be affecting the majority of the upper eyelid.  Recommending continued use of warm moist compresses with gentle massage of the upper lid to loosen any plugs or clogged glands.  Will prescribe Polytrim.  Final Clinical Impressions(s) / UC Diagnoses   Final diagnoses:  None   Discharge Instructions   None    ED Prescriptions   None    PDMP not reviewed this encounter.   Charma Igo, [place] 06/16/22 1846

## 2022-09-13 ENCOUNTER — Ambulatory Visit
Admission: RE | Admit: 2022-09-13 | Discharge: 2022-09-13 | Disposition: A | Discharge status: Home / Self Care | Payer: BC Managed Care – PPO | Source: Ambulatory Visit | Attending: Urgent Care | Admitting: Urgent Care

## 2022-09-13 VITALS — BP 109/78 | HR 103 | Temp 98.2°F | Resp 18 | Ht 67.0 in | Wt 245.0 lb

## 2022-09-13 DIAGNOSIS — B3731 Acute candidiasis of vulva and vagina: Secondary | ICD-10-CM | POA: Diagnosis not present

## 2022-09-13 DIAGNOSIS — N3001 Acute cystitis with hematuria: Secondary | ICD-10-CM | POA: Diagnosis present

## 2022-09-13 LAB — POCT URINALYSIS DIP (MANUAL ENTRY)
Glucose, UA: NEGATIVE mg/dL
Ketones, POC UA: NEGATIVE mg/dL
Nitrite, UA: NEGATIVE
Protein Ur, POC: 30 mg/dL — AB
Spec Grav, UA: 1.02 (ref 1.010–1.025)
Urobilinogen, UA: 0.2 E.U./dL
pH, UA: 6 (ref 5.0–8.0)

## 2022-09-13 MED ORDER — SULFAMETHOXAZOLE-TRIMETHOPRIM 800-160 MG PO TABS: 1.0000 | ORAL_TABLET | Freq: Two times a day (BID) | ORAL | 0 refills | Status: AC

## 2022-09-13 MED ORDER — FLUCONAZOLE 150 MG PO TABS: 150.0000 mg | ORAL_TABLET | Freq: Once | ORAL | 0 refills | Status: AC

## 2022-09-13 NOTE — ED Triage Notes (Signed)
Patient to Urgent Care with complaints of urinary frequency. Reports pain at the end of urination. Some lower back pain.   Symptoms started last night. States she was up all night using the bathroom.   Took diflucan last night because she was initially concerned that she could have a yeast infection.

## 2022-09-13 NOTE — Discharge Instructions (Signed)
Follow up here or with your primary care provider if your symptoms are worsening or not improving.     

## 2022-09-13 NOTE — ED Provider Notes (Signed)
Roderic Palau    CSN: [id number] Arrival date & time: 09/13/22  1253      History   Chief Complaint Chief Complaint  Patient presents with   Urinary Frequency    I feel sure I have a UTI.  Frequency urinating and then slight pain at the end. - Entered by patient    HPI Maureen Gallegos is a 62 y.o. female.    Urinary Frequency    Presents to urgent care with complaint of urinary frequency and dysuria at the end of urination.  Reports some lower back pain.  Symptoms starting yesterday and reports being up all night using the bathroom.  Patient reports she took a dose of Diflucan last night because of initial concern for yeast infection.  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  She states she stopped this medication last September (2023) at the direction of her PCP.  No UTIs since.  She continues to have a supply of Bactrim.  Past Medical History:  Diagnosis Date   GERD (gastroesophageal reflux disease)    Heart murmur    Pulmonary stenosis    as a child    There are no problems to display for this patient.   Past Surgical History:  Procedure Laterality Date   AUGMENTATION MAMMAPLASTY Bilateral    over 20 years   GALLBLADDER SURGERY     tubiligation      OB History   No obstetric history on file.      Home Medications    Prior to Admission medications   Medication Sig Start Date End Date Taking? Authorizing Provider  Apoaequorin (PREVAGEN) 10 MG CAPS Take by mouth.    [provider]  Ascorbic Acid (VITAMIN C) 1000 MG tablet Take 1,000 mg by mouth daily. Patient not taking: Reported on 09/13/2022    [provider]  celecoxib (CELEBREX) 200 MG capsule Take 200 mg by mouth daily.  Patient not taking: Reported on 09/13/2022 06/27/19   [provider]  Cholecalciferol (VITAMIN D3) 1.25 MG (50000 UT) TABS Take 5,000 Units by mouth every evening. Patient not taking: Reported on 09/13/2022     [provider]  diazepam (VALIUM) 5 MG tablet TAKE 1 TABLET BY MOUTH 1 HOUR BEFORE PROCEDURE. Patient not taking: Reported on 09/13/2022    [provider]  erythromycin ophthalmic ointment APPLY TO RIGHT EYE EVERY EVENING FOR 14 DAYS APPLY SMALL APPLICATION TO THE UPPER EYELID MARGIN AT BEDTIME FOR 2WKS Patient not taking: Reported on 09/13/2022    [provider]  esomeprazole (NEXIUM) 20 MG capsule Take 20 mg by mouth daily at 12 noon.    [provider]  estradiol (ESTRACE) 0.1 MG/GM vaginal cream Insert fingertip unit amount nightly x 2 weeks, then every other night x 2 weeks, then 2-3 times weekly for maintenance 12/09/21   [provider]  ipratropium (ATROVENT) 0.03 % nasal spray Place 2 sprays into both nostrils 2 (two) times daily as needed for rhinitis. Patient not taking: Reported on 09/13/2022 12/07/21   Scot Jun, NP  nystatin cream (MYCOSTATIN) Apply to affected area 2 times daily for 14 days. 07/05/21   Sharion Balloon, NP  polyethylene glycol (GOLYTELY) 236 g solution     [provider]  sulfamethoxazole-trimethoprim (BACTRIM DS) 800-160 MG tablet Take 1 tablet by mouth 2 (two) times daily. 12/07/21   Scot Jun, NP  vitamin B-12 (CYANOCOBALAMIN) 100 MCG tablet Take 100 mcg by mouth  daily. Patient not taking: Reported on 09/13/2022    [provider]    Family History Family History  Problem Relation Age of Onset   Breast cancer Neg Hx     Social History Social History   Tobacco Use   Smoking status: Never   Smokeless tobacco: Never  Vaping Use   Vaping Use: Never used  Substance Use Topics   Alcohol use: No   Drug use: No     Allergies   Penicillins   Review of Systems Review of Systems  Genitourinary:  Positive for frequency.     Physical Exam Triage Vital Signs ED Triage Vitals [09/13/22 1323]  Enc Vitals Group     BP      Pulse      Resp      Temp      Temp src      SpO2       Weight 245 lb (111.1 kg)     Height 5\' 7"  (1.702 m)     Head Circumference      Peak Flow      Pain Score 2     Pain Loc      Pain Edu?      Excl. in [place]?    No data found.  Updated Vital Signs Ht 5\' 7"  (1.702 m)   Wt 245 lb (111.1 kg)   BMI 38.37 kg/m   Visual Acuity Right Eye Distance:   Left Eye Distance:   Bilateral Distance:    Right Eye Near:   Left Eye Near:    Bilateral Near:     Physical Exam Vitals reviewed.  Constitutional:      Appearance: Normal appearance.  Neurological:     General: No focal deficit present.     Mental Status: She is alert and oriented to person, place, and time.  Psychiatric:        Mood and Affect: Mood normal.        Behavior: Behavior normal.      UC Treatments / Results  Labs (all labs ordered are listed, but only abnormal results are displayed) Labs Reviewed  POCT URINALYSIS DIP (MANUAL ENTRY)    EKG   Radiology No results found.  Procedures Procedures (including critical care time)  Medications Ordered in UC Medications - No data to display  Initial Impression / Assessment and Plan / UC Course  I have reviewed the triage vital signs and the nursing notes.  Pertinent labs & imaging results that were available during my care of the patient were reviewed by me and considered in my medical decision making (see chart for details).   UA is suggestive of UTI with small leukocytes.  Given patient has fill of Bactrim and has previously tolerated, will recommend she restart Bactrim at twice a day for 3 days.  Will send culture to verify susceptibility and refer her back to gynecologist/PCP to determine if prophylaxis is necessary for the future.  Diflucan is ordered for inevitable yeast infection.  Patient acknowledges understanding and agreement with this treatment plan.  Final Clinical Impressions(s) / UC Diagnoses   Final diagnoses:  None   Discharge Instructions   None    ED Prescriptions   None     PDMP not reviewed this encounter.   Rose Phi, [place] 09/13/22 1334

## 2022-09-15 LAB — URINE CULTURE: Culture: 60000 — AB

## 2022-10-21 ENCOUNTER — Telehealth: Payer: Self-pay

## 2022-10-21 ENCOUNTER — Other Ambulatory Visit: Payer: Self-pay | Admitting: Internal Medicine

## 2022-10-21 DIAGNOSIS — Z1231 Encounter for screening mammogram for malignant neoplasm of breast: Secondary | ICD-10-CM

## 2022-10-21 NOTE — Telephone Encounter (Signed)
Made appointment with Dr. Servando Snare on 02/07/2023

## 2022-10-21 NOTE — Telephone Encounter (Signed)
We received a referral for patient for Pancreatic insufficiency and Fecal urgency. Patient was seeing [facility] and had her last colonoscopy with them over a year ago. She states her doctor retired and is wanting to transfer her care to You. Are you will to see patient?

## 2023-02-07 ENCOUNTER — Encounter: Payer: Self-pay | Admitting: Gastroenterology

## 2023-02-07 ENCOUNTER — Ambulatory Visit (INDEPENDENT_AMBULATORY_CARE_PROVIDER_SITE_OTHER): Payer: BC Managed Care – PPO | Admitting: Gastroenterology

## 2023-02-07 VITALS — BP 108/78 | HR 96 | Temp 98.4°F | Wt 253.0 lb

## 2023-02-07 DIAGNOSIS — K219 Gastro-esophageal reflux disease without esophagitis: Secondary | ICD-10-CM

## 2023-02-07 DIAGNOSIS — K8681 Exocrine pancreatic insufficiency: Secondary | ICD-10-CM | POA: Diagnosis not present

## 2023-02-07 MED ORDER — PANCRELIPASE (LIP-PROT-AMYL) 36000-114000 UNITS PO CPEP: 36000.0000 [IU] | ORAL_CAPSULE | Freq: Three times a day (TID) | ORAL | 5 refills | Status: DC

## 2023-02-07 MED ORDER — PANTOPRAZOLE SODIUM 40 MG PO TBEC: 40.0000 mg | DELAYED_RELEASE_TABLET | Freq: Every day | ORAL | 5 refills | Status: DC

## 2023-02-07 NOTE — Progress Notes (Signed)
Gastroenterology Consultation  Referring Provider:     Lynnea Ferrier, MD Primary Care Physician:  Lynnea Ferrier, MD Primary Gastroenterologist:  Dr. Servando Snare     Reason for Consultation:     Pancreatic insufficiency        HPI:   Maureen Gallegos is a 62 y.o. y/o female referred for consultation & management of pancreatic insufficiency by Dr. Graciela Husbands, Susanne Borders III, MD. This patient comes in today after having multiple interactions with multiple different gastroenterologist in the past.  The patient had been seen at [facility] by Dr. Genelle Gather and then was followed at the [facility] clinic by Dr. Shelle Iron.  At that time the patient was being seen for abnormal liver enzymes.  In 2022 the patient had a repeat colonoscopy at [facility] by Dr. Enis Slipper with a poor prep and then 1 days later the patient had a colonoscopy by Dr. Jorene Minors.  The patient subsequently seen by her primary care provider back in April with bloating and gas and had a pancreatic elastase sent off that showed moderate pancreatic insufficiency.  The patient has had elevated liver enzymes dating all the way back to 2015 and her most recent labs showed an isolated increase in ALT of 57. On further review of the patient's chart she had been getting colonoscopies every 5 years due to a history of polyps but her polyps have always been hyperplastic polyps located in the rectum.  The patient's last colonoscopy 2 years ago was recommended to have a repeat colonoscopy in 10 years. She states that her diarrhea has been better but she was not started on the pancreatic enzymes and states that she was put on Benefiber. The patient also reports that she has had significant amounts of GERD.  She denies any dysphagia nausea vomiting black stools or bloody stools.  She also denies any unexplained weight loss.  Past Medical History:  Diagnosis Date   GERD (gastroesophageal reflux disease)    Heart murmur    Pulmonary stenosis    as a child    Past Surgical  History:  Procedure Laterality Date   AUGMENTATION MAMMAPLASTY Bilateral    over 20 years   GALLBLADDER SURGERY     tubiligation      Prior to Admission medications   Medication Sig Start Date End Date Taking? Authorizing Provider  Apoaequorin (PREVAGEN) 10 MG CAPS Take by mouth.    [provider]  Ascorbic Acid (VITAMIN C) 1000 MG tablet Take 1,000 mg by mouth daily. Patient not taking: Reported on 09/13/2022    [provider]  celecoxib (CELEBREX) 200 MG capsule Take 200 mg by mouth daily.  Patient not taking: Reported on 09/13/2022 06/27/19   [provider]  Cholecalciferol (VITAMIN D3) 1.25 MG (50000 UT) TABS Take 5,000 Units by mouth every evening. Patient not taking: Reported on 09/13/2022    [provider]  diazepam (VALIUM) 5 MG tablet TAKE 1 TABLET BY MOUTH 1 HOUR BEFORE PROCEDURE. Patient not taking: Reported on 09/13/2022    [provider]  erythromycin ophthalmic ointment APPLY TO RIGHT EYE EVERY EVENING FOR 14 DAYS APPLY SMALL APPLICATION TO THE UPPER EYELID MARGIN AT BEDTIME FOR 2WKS Patient not taking: Reported on 09/13/2022    [provider]  esomeprazole (NEXIUM) 20 MG capsule Take 20 mg by mouth daily at 12 noon.    [provider]  estradiol (ESTRACE) 0.1 MG/GM vaginal cream Insert fingertip unit amount nightly x 2 weeks, then every  other night x 2 weeks, then 2-3 times weekly for maintenance 12/09/21   [provider]  ipratropium (ATROVENT) 0.03 % nasal spray Place 2 sprays into both nostrils 2 (two) times daily as needed for rhinitis. Patient not taking: Reported on 09/13/2022 12/07/21   Bing Neighbors, NP  nystatin cream (MYCOSTATIN) Apply to affected area 2 times daily for 14 days. 07/05/21   Mickie Bail, NP  polyethylene glycol (GOLYTELY) 236 g solution     [provider]  sulfamethoxazole-trimethoprim (BACTRIM DS) 800-160 MG tablet Take 1 tablet by mouth 2 (two) times daily. 12/07/21    Bing Neighbors, NP  vitamin B-12 (CYANOCOBALAMIN) 100 MCG tablet Take 100 mcg by mouth daily. Patient not taking: Reported on 09/13/2022    [provider]    Family History  Problem Relation Age of Onset   Breast cancer Neg Hx      Social History   Tobacco Use   Smoking status: Never   Smokeless tobacco: Never  Vaping Use   Vaping status: Never Used  Substance Use Topics   Alcohol use: No   Drug use: No    Allergies as of 02/07/2023 - Review Complete 02/07/2023  Allergen Reaction Noted   Penicillins Hives and Other (See Comments) 11/25/2012    Review of Systems:    All systems reviewed and negative except where noted in HPI.   Physical Exam:  BP 108/78 (BP Location: Left Arm, Patient Position: Sitting, Cuff Size: Large)   Pulse 96   Temp 98.4 F (36.9 C) (Oral)   Wt 253 lb (114.8 kg)   BMI 39.63 kg/m  No LMP recorded. Patient is postmenopausal. General:   Alert,  Well-developed, well-nourished, pleasant and cooperative in NAD Head:  Normocephalic and atraumatic. Eyes:  Sclera clear, no icterus.   Conjunctiva pink. Ears:  Normal auditory acuity. Neck:  Supple; no masses or thyromegaly. Lungs:  Respirations even and unlabored.  Clear throughout to auscultation.   No wheezes, crackles, or rhonchi. No acute distress. Heart:  Regular rate and rhythm; no murmurs, clicks, rubs, or gallops. Abdomen:  Normal bowel sounds.  No bruits.  Soft, non-tender and non-distended without masses, hepatosplenomegaly or hernias noted.  No guarding or rebound tenderness.  Negative Carnett sign.   Rectal:  Deferred.  Pulses:  Normal pulses noted. Extremities:  No clubbing or edema.  No cyanosis. Neurologic:  Alert and oriented x3;  grossly normal neurologically. Skin:  Intact without significant lesions or rashes.  No jaundice. Lymph Nodes:  No significant cervical adenopathy. Psych:  Alert and cooperative. Normal mood and affect.  Imaging Studies: No results  found.  Assessment and Plan:   Maureen Gallegos is a 62 y.o. y/o female who comes in today with a history of diarrhea and urgency who also reports that she is having GERD.  The patient has had GERD not helped with over-the-counter medication and will be started on Protonix daily.  The patient has also been told to start on pancreatic enzymes since her fecal elastase came back as moderate pancreatic insufficiency.  The patient has been told the plan and has been told to contact me if her symptoms do not improve.  The patient has been explained the plan and agrees with it.    Midge Minium, MD. Clementeen Graham    Note: This dictation was prepared with Dragon dictation along with smaller phrase technology. Any transcriptional errors that result from this process are unintentional.

## 2023-02-09 ENCOUNTER — Telehealth: Payer: Self-pay

## 2023-02-09 NOTE — Telephone Encounter (Signed)
Per pt pharmacy needs more information to fill Creon prescription.   Per pt she is still unable to get prescription for Creon.

## 2023-02-14 ENCOUNTER — Encounter: Payer: Self-pay | Admitting: Gastroenterology

## 2023-02-14 ENCOUNTER — Ambulatory Visit
Admission: RE | Admit: 2023-02-14 | Discharge: 2023-02-14 | Disposition: A | Discharge status: Home / Self Care | Payer: BC Managed Care – PPO | Source: Ambulatory Visit | Attending: Internal Medicine | Admitting: Internal Medicine

## 2023-02-14 DIAGNOSIS — Z1231 Encounter for screening mammogram for malignant neoplasm of breast: Secondary | ICD-10-CM

## 2023-02-14 NOTE — Telephone Encounter (Signed)
I was not able to submit via [email or web address]... I called [facility] [employer] and they will fax me a form to fax back to complete the PA... Pt is aware and expressed uderstanding

## 2023-02-15 NOTE — Telephone Encounter (Signed)
PA form with progress notes attached faxed to [employer] x 2

## 2023-04-04 ENCOUNTER — Telehealth: Payer: Self-pay | Admitting: Gastroenterology

## 2023-04-04 MED ORDER — PANCRELIPASE (LIP-PROT-AMYL) 36000-114000 UNITS PO CPEP: 36000.0000 [IU] | ORAL_CAPSULE | Freq: Three times a day (TID) | ORAL | 2 refills | Status: AC

## 2023-04-04 NOTE — Addendum Note (Signed)
Addended by: Roena Malady on: 04/04/2023 05:32 PM   Modules accepted: Orders

## 2023-04-04 NOTE — Telephone Encounter (Signed)
Pt requesting call back to discuss her creon refills

## 2023-04-04 NOTE — Telephone Encounter (Signed)
Patient states that the pharmacy will only dispense 100 count at a time because they can not open the bottle. She would like a call back

## 2023-04-04 NOTE — Telephone Encounter (Signed)
Rx sent through [facility] for #[phone]

## 2023-05-20 ENCOUNTER — Inpatient Hospital Stay
Admission: RE | Admit: 2023-05-20 | Discharge: 2023-05-20 | Disposition: A | Discharge status: Home / Self Care | Payer: Self-pay | Source: Ambulatory Visit

## 2023-05-20 ENCOUNTER — Ambulatory Visit
Admission: EM | Admit: 2023-05-20 | Discharge: 2023-05-20 | Disposition: A | Discharge status: Home / Self Care | Payer: BC Managed Care – PPO

## 2023-05-20 DIAGNOSIS — J069 Acute upper respiratory infection, unspecified: Secondary | ICD-10-CM

## 2023-05-20 HISTORY — DX: Gastro-esophageal reflux disease without esophagitis: K21.9

## 2023-05-20 LAB — POC COVID19/FLU A&B COMBO
Covid Antigen, POC: NEGATIVE
Influenza A Antigen, POC: NEGATIVE
Influenza B Antigen, POC: NEGATIVE

## 2023-05-20 MED ORDER — PROMETHAZINE-DM 6.25-15 MG/5ML PO SYRP: 5.0000 mL | ORAL_SOLUTION | Freq: Four times a day (QID) | ORAL | 0 refills | Status: AC | PRN

## 2023-05-20 MED ORDER — BENZONATATE 100 MG PO CAPS: 100.0000 mg | ORAL_CAPSULE | Freq: Three times a day (TID) | ORAL | 0 refills | Status: AC | PRN

## 2023-05-20 NOTE — ED Provider Notes (Signed)
[name]    CSN: [id number] Arrival date & time: 05/20/23  1705      History   Chief Complaint Chief Complaint  Patient presents with   Cough    HPI Maureen Gallegos is a 62 y.o. female.  Patient presents with 2-day history of fever, congestion, cough.  Tmax 100.  She has been treating her symptoms with DayQuil and NyQuil.  No shortness of breath or other symptoms. The history is provided by the patient and medical records.    Past Medical History:  Diagnosis Date   GERD (gastroesophageal reflux disease)    Heart murmur    Pulmonary stenosis    as a child    Patient Active Problem List   Diagnosis Date Noted   Dyslipidemia 07/20/2021   Valvular heart disease 05/02/2019   Gastroesophageal reflux disease without esophagitis 04/25/2019    Past Surgical History:  Procedure Laterality Date   AUGMENTATION MAMMAPLASTY Bilateral    over 20 years   GALLBLADDER SURGERY     tubiligation      OB History   No obstetric history on file.      Home Medications    Prior to Admission medications   Medication Sig Start Date End Date Taking? Authorizing Provider  benzonatate (TESSALON) 100 MG capsule Take 1 capsule (100 mg total) by mouth 3 (three) times daily as needed for cough. 05/20/23  Yes Mickie Bail, NP  lipase/protease/amylase (CREON) 36000 UNITS CPEP capsule Take 1 capsule (36,000 Units total) by mouth 3 (three) times daily before meals. 1 capsule daily with snack 04/04/23  Yes Midge Minium, MD  pantoprazole (PROTONIX) 40 MG tablet Take 1 tablet (40 mg total) by mouth daily. 02/07/23  Yes Midge Minium, MD  promethazine-dextromethorphan (PROMETHAZINE-DM) 6.25-15 MG/5ML syrup Take 5 mLs by mouth 4 (four) times daily as needed. 05/20/23  Yes Mickie Bail, NP  trimethoprim (TRIMPEX) 100 MG tablet  10/20/22  Yes [provider]  Ascorbic Acid (VITAMIN C) 1000 MG tablet Take 1,000 mg by mouth daily.    [provider]  Cholecalciferol (VITAMIN D3)  1.25 MG (50000 UT) TABS Take 5,000 Units by mouth every evening.    [provider]  estradiol (ESTRACE) 0.1 MG/GM vaginal cream Insert fingertip unit amount nightly x 2 weeks, then every other night x 2 weeks, then 2-3 times weekly for maintenance 12/09/21   [provider]  vitamin B-12 (CYANOCOBALAMIN) 100 MCG tablet Take 100 mcg by mouth daily.    [provider]  Wheat Dextrin (BENEFIBER PO) Take by mouth.    [provider]    Family History Family History  Problem Relation Age of Onset   Breast cancer Neg Hx     Social History Social History   Tobacco Use   Smoking status: Never   Smokeless tobacco: Never  Vaping Use   Vaping status: Never Used  Substance Use Topics   Alcohol use: No   Drug use: No     Allergies   Penicillins   Review of Systems Review of Systems  Constitutional:  Positive for fever. Negative for chills.  HENT:  Positive for congestion. Negative for ear pain and sore throat.   Respiratory:  Positive for cough. Negative for shortness of breath.      Physical Exam Triage Vital Signs ED Triage Vitals [05/20/23 1735]  Encounter Vitals Group     BP      Systolic BP Percentile      Diastolic BP  Percentile      Pulse      Resp      Temp      Temp src      SpO2      Weight      Height      Head Circumference      Peak Flow      Pain Score 5     Pain Loc      Pain Education      Exclude from Growth Chart    No data found.  Updated Vital Signs BP 111/63 (BP Location: Right Arm)   Pulse 96   Temp (!) 100.6 F (38.1 C) (Oral)   Resp 20   SpO2 99%   Visual Acuity Right Eye Distance:   Left Eye Distance:   Bilateral Distance:    Right Eye Near:   Left Eye Near:    Bilateral Near:     Physical Exam Vitals and nursing note reviewed.  Constitutional:      General: She is not in acute distress.    Appearance: She is well-developed.  HENT:     Right Ear: Tympanic membrane normal.     Left Ear:  Tympanic membrane normal.     Nose: Rhinorrhea present.     Mouth/Throat:     Mouth: Mucous membranes are moist.     Pharynx: Oropharynx is clear.  Cardiovascular:     Rate and Rhythm: Normal rate and regular rhythm.     Heart sounds: Normal heart sounds.  Pulmonary:     Effort: Pulmonary effort is normal. No respiratory distress.     Breath sounds: Normal breath sounds.  Musculoskeletal:     Cervical back: Neck supple.  Skin:    General: Skin is warm and dry.  Neurological:     Mental Status: She is alert.      UC Treatments / Results  Labs (all labs ordered are listed, but only abnormal results are displayed) Labs Reviewed  POC COVID19/FLU A&B COMBO    EKG   Radiology No results found.  Procedures Procedures (including critical care time)  Medications Ordered in UC Medications - No data to display  Initial Impression / Assessment and Plan / UC Course  I have reviewed the triage vital signs and the nursing notes.  Pertinent labs & imaging results that were available during my care of the patient were reviewed by me and considered in my medical decision making (see chart for details).    Viral URI with cough.  Rapid COVID and flu negative.  Discussed symptomatic treatment including Tylenol or ibuprofen as needed for fever or discomfort.  Treating cough with Tessalon Perles or Promethazine DM.  Precautions for drowsiness with promethazine discussed.  Instructed patient to follow-up with PCP if not improving.  ED precautions given.  Patient agrees to plan of care.   Final Clinical Impressions(s) / UC Diagnoses   Final diagnoses:  Viral URI with cough     Discharge Instructions      The COVID and flu tests are negative.   Take the Tessalon Perles or Promethazine DM as directed for cough.  Do not drive, operate machinery, drink alcohol, or perform dangerous activities while taking promethazine as it may cause drowsiness.   Take Tylenol or ibuprofen as needed  for fever or discomfort.      Follow-up with your primary care provider if your symptoms are not improving.         ED Prescriptions  Medication Sig Dispense Auth. Provider   benzonatate (TESSALON) 100 MG capsule Take 1 capsule (100 mg total) by mouth 3 (three) times daily as needed for cough. 21 capsule Mickie Bail, NP   promethazine-dextromethorphan (PROMETHAZINE-DM) 6.25-15 MG/5ML syrup Take 5 mLs by mouth 4 (four) times daily as needed. 118 mL Mickie Bail, NP      PDMP not reviewed this encounter.   Mickie Bail, NP 05/20/23 [phone]

## 2023-05-20 NOTE — Discharge Instructions (Signed)
The COVID and flu tests are negative.   Take the Tessalon Perles or Promethazine DM as directed for cough.  Do not drive, operate machinery, drink alcohol, or perform dangerous activities while taking promethazine as it may cause drowsiness.   Take Tylenol or ibuprofen as needed for fever or discomfort.      Follow-up with your primary care provider if your symptoms are not improving.

## 2023-05-20 NOTE — ED Triage Notes (Signed)
Cough started wed night. Patient is taking dayquil

## 2023-07-07 ENCOUNTER — Encounter: Payer: Self-pay | Admitting: Gastroenterology

## 2023-07-07 MED ORDER — PANTOPRAZOLE SODIUM 40 MG PO TBEC: 40.0000 mg | DELAYED_RELEASE_TABLET | Freq: Every day | ORAL | 1 refills | Status: AC

## 2023-11-25 ENCOUNTER — Other Ambulatory Visit: Payer: Self-pay | Admitting: Internal Medicine

## 2023-11-25 ENCOUNTER — Encounter: Payer: Self-pay | Admitting: Internal Medicine

## 2023-11-25 DIAGNOSIS — R748 Abnormal levels of other serum enzymes: Secondary | ICD-10-CM

## 2023-11-29 ENCOUNTER — Ambulatory Visit
Admission: RE | Admit: 2023-11-29 | Discharge: 2023-11-29 | Disposition: A | Discharge status: Home / Self Care | Source: Ambulatory Visit | Attending: Internal Medicine | Admitting: Internal Medicine

## 2023-11-29 DIAGNOSIS — R748 Abnormal levels of other serum enzymes: Secondary | ICD-10-CM | POA: Insufficient documentation

## 2024-01-02 ENCOUNTER — Other Ambulatory Visit: Payer: Self-pay | Admitting: Internal Medicine

## 2024-01-02 DIAGNOSIS — Z1231 Encounter for screening mammogram for malignant neoplasm of breast: Secondary | ICD-10-CM

## 2024-02-15 ENCOUNTER — Ambulatory Visit
Admission: RE | Admit: 2024-02-15 | Discharge: 2024-02-15 | Disposition: A | Discharge status: Home / Self Care | Source: Ambulatory Visit | Attending: Internal Medicine | Admitting: Internal Medicine

## 2024-02-15 DIAGNOSIS — Z1231 Encounter for screening mammogram for malignant neoplasm of breast: Secondary | ICD-10-CM

## 2024-07-25 ENCOUNTER — Ambulatory Visit
Admission: EM | Admit: 2024-07-25 | Discharge: 2024-07-25 | Disposition: A | Discharge status: Home / Self Care | Source: Home / Self Care

## 2024-07-25 ENCOUNTER — Encounter: Payer: Self-pay | Admitting: Emergency Medicine

## 2024-07-25 DIAGNOSIS — J3489 Other specified disorders of nose and nasal sinuses: Secondary | ICD-10-CM | POA: Diagnosis not present

## 2024-07-25 NOTE — ED Triage Notes (Signed)
 Patient reports bilateral ear pain and bilateral jaw pain and headache that x 1 day. Rates jaw pain 3/10 and headache 3/10. Patient has not taken anything for symptoms. Patient states her ears do not hurt but feels itchy. Patient has been stressed out because her friend is on Hospice

## 2024-07-25 NOTE — Discharge Instructions (Signed)
 Today you are evaluated for your itchy ears and tenderness to the face and temple area which I do believe is related to your sinuses possibly affected by the weather  EKG shows that the heart is beating in a regular pace and rhythm and your lungs are clear when listen to and your heart is beating normally when listening to  Would recommend attempting use of ibuprofen and Tylenol for discomfort  Would not recommend a medication for congestion such as Sudafed, nasal spray or Mucinex  May warm warm compresses over the affected area 10 to 15-minute intervals  May massage the sinuses and glands as tolerated  If symptoms continue to persist or at any point worsen please follow-up for reevaluation

## 2024-07-25 NOTE — ED Provider Notes (Signed)
 " Maureen Gallegos    CSN: [id number] Arrival date & time: 07/25/24  1413      History   Chief Complaint Chief Complaint  Patient presents with   Otalgia   Jaw Pain   Headache    HPI Maureen Gallegos is a 64 y.o. female.   Patient presents for evaluation of bilateral ear itching, sinus pain and pressure to the temples and to the cheeks, a left-sided chest tightness occurring intermittently and headaches beginning 1 day ago.  Denying nasal congestion or cough, fever.  No known sick contacts.  Endorses increased stress and anxiety.  Known sick contact with exposure to an ear infection.  Decreased appetite today but tolerable to both food and liquids.  Denies shortness of breath, dizziness, blurred vision.  History of a heart murmur and dyslipidemia.    Past Medical History:  Diagnosis Date   GERD (gastroesophageal reflux disease)    Heart murmur    Pulmonary stenosis    as a child    Patient Active Problem List   Diagnosis Date Noted   Dyslipidemia 07/20/2021   Valvular heart disease 05/02/2019   Gastroesophageal reflux disease without esophagitis 04/25/2019    Past Surgical History:  Procedure Laterality Date   AUGMENTATION MAMMAPLASTY Bilateral    over 20 years   GALLBLADDER SURGERY     tubiligation      OB History   No obstetric history on file.      Home Medications    Prior to Admission medications  Medication Sig Start Date End Date Taking? Authorizing Provider  Ascorbic Acid (VITAMIN C) 1000 MG tablet Take 1,000 mg by mouth daily.    [provider]  benzonatate  (TESSALON ) 100 MG capsule Take 1 capsule (100 mg total) by mouth 3 (three) times daily as needed for cough. 05/20/23   Corlis Burnard DEL, NP  Cholecalciferol (VITAMIN D3) 1.25 MG (50000 UT) TABS Take 5,000 Units by mouth every evening.    [provider]  doxycycline (MONODOX) 100 MG capsule Take 1 capsule by mouth 2 (two) times daily.    [provider]  estradiol  (ESTRACE) 0.1 MG/GM vaginal cream Insert fingertip unit amount nightly x 2 weeks, then every other night x 2 weeks, then 2-3 times weekly for maintenance 12/09/21   [provider]  lipase/protease/amylase (CREON ) 36000 UNITS CPEP capsule Take 1 capsule (36,000 Units total) by mouth 3 (three) times daily before meals. 1 capsule daily with snack 04/04/23   Jinny Carmine, MD  pantoprazole  (PROTONIX ) 40 MG tablet Take 1 tablet (40 mg total) by mouth daily. 07/07/23   Jinny Carmine, MD  promethazine -dextromethorphan (PROMETHAZINE -DM) 6.25-15 MG/5ML syrup Take 5 mLs by mouth 4 (four) times daily as needed. 05/20/23   Corlis Burnard DEL, NP  trimethoprim  (TRIMPEX ) 100 MG tablet  10/20/22   [provider]  trimethoprim  (TRIMPEX ) 100 MG tablet Take 100 mg by mouth 2 (two) times daily.    [provider]  vitamin B-12 (CYANOCOBALAMIN) 100 MCG tablet Take 100 mcg by mouth daily.    [provider]  Wheat Dextrin (BENEFIBER PO) Take by mouth.    [provider]    Family History Family History  Problem Relation Age of Onset   Breast cancer Neg Hx     Social History Social History[1]   Allergies   Penicillins and Penicillins   Review of Systems Review of Systems  Constitutional: Negative.   HENT:  Positive for ear pain, sinus pressure and sinus  pain. Negative for congestion, dental problem, drooling, ear discharge, facial swelling, hearing loss, mouth sores, nosebleeds, postnasal drip, rhinorrhea, sneezing, sore throat, tinnitus, trouble swallowing and voice change.   Respiratory:  Positive for chest tightness. Negative for apnea, cough, choking, shortness of breath, wheezing and stridor.   Neurological:  Positive for headaches. Negative for dizziness, tremors, seizures, syncope, facial asymmetry, speech difficulty, weakness, light-headedness and numbness.     Physical Exam Triage Vital Signs ED Triage Vitals  Encounter Vitals Group     BP 07/25/24 1514  132/85     Girls Systolic BP Percentile --      Girls Diastolic BP Percentile --      Boys Systolic BP Percentile --      Boys Diastolic BP Percentile --      Pulse Rate 07/25/24 1514 90     Resp 07/25/24 1514 20     Temp 07/25/24 1514 98.3 F (36.8 C)     Temp Source 07/25/24 1514 Oral     SpO2 07/25/24 1514 95 %     Weight --      Height --      Head Circumference --      Peak Flow --      Pain Score 07/25/24 1516 2     Pain Loc --      Pain Education --      Exclude from Growth Chart --    No data found.  Updated Vital Signs BP 132/85 (BP Location: Right Arm)   Pulse 90   Temp 98.3 F (36.8 C) (Oral)   Resp 20   SpO2 95%   Visual Acuity Right Eye Distance:   Left Eye Distance:   Bilateral Distance:    Right Eye Near:   Left Eye Near:    Bilateral Near:     Physical Exam Constitutional:      Appearance: Normal appearance.  HENT:     Head: Normocephalic.     Right Ear: Tympanic membrane, ear canal and external ear normal.     Left Ear: Tympanic membrane, ear canal and external ear normal.     Nose: Nose normal.     Right Sinus: No maxillary sinus tenderness or frontal sinus tenderness.     Left Sinus: No maxillary sinus tenderness or frontal sinus tenderness.     Mouth/Throat:     Mouth: Mucous membranes are moist.     Pharynx: Oropharynx is clear. No oropharyngeal exudate or posterior oropharyngeal erythema.  Eyes:     Extraocular Movements: Extraocular movements intact.  Cardiovascular:     Rate and Rhythm: Normal rate and regular rhythm.     Pulses: Normal pulses.     Heart sounds: Normal heart sounds.  Pulmonary:     Effort: Pulmonary effort is normal.     Breath sounds: Normal breath sounds.  Musculoskeletal:     Cervical back: Normal range of motion and neck supple.  Neurological:     Mental Status: She is alert and oriented to person, place, and time. Mental status is at baseline.      UC Treatments / Results  Labs (all labs ordered are  listed, but only abnormal results are displayed) Labs Reviewed - No data to display  EKG   Radiology No results found.  Procedures Procedures (including critical care time)  Medications Ordered in UC Medications - No data to display  Initial Impression / Assessment and Plan / UC Course  I have reviewed the triage vital signs and  the nursing notes.  Pertinent labs & imaging results that were available during my care of the patient were reviewed by me and considered in my medical decision making (see chart for details).  Sinus pressure  Vitals are stable, patient in no signs of distress nontoxic-appearing, endorsing intermittent chest tightness, EKG shows normal sinus rhythm, no prior cardiac history aside from a heart murmur, stable for outpatient management low suspicion for cardiac involvement based on description of symptoms possibly sinus related recommended supportive care through over-the-counter medications and nonpharmacological measures advised follow-up with urgent care as needed symptoms persist  Final Clinical Impressions(s) / UC Diagnoses   Final diagnoses:  None   Discharge Instructions   None    ED Prescriptions   None    PDMP not reviewed this encounter.     [1]  Social History Tobacco Use   Smoking status: Never    Passive exposure: Never   Smokeless tobacco: Never  Vaping Use   Vaping status: Never Used  Substance Use Topics   Alcohol use: No   Drug use: No     Teresa Shelba SAUNDERS, NP 07/25/24 1648  "
